# Patient Record
Sex: Female | Born: 1990 | Hispanic: No | Marital: Married | Smoking: Never smoker
Health system: Southern US, Community
[De-identification: ages and names within clinical notes are randomized; demographics above are authoritative.]

## PROBLEM LIST (undated history)

## (undated) ENCOUNTER — Inpatient Hospital Stay (HOSPITAL_COMMUNITY): Payer: Self-pay

## (undated) DIAGNOSIS — Z789 Other specified health status: Secondary | ICD-10-CM

## (undated) DIAGNOSIS — E119 Type 2 diabetes mellitus without complications: Secondary | ICD-10-CM

## (undated) HISTORY — DX: Type 2 diabetes mellitus without complications: E11.9

## (undated) HISTORY — PX: NO PAST SURGERIES: SHX2092

---

## 2016-04-06 ENCOUNTER — Emergency Department (HOSPITAL_BASED_OUTPATIENT_CLINIC_OR_DEPARTMENT_OTHER)
Admission: EM | Admit: 2016-04-06 | Discharge: 2016-04-06 | Disposition: A | Discharge status: Home / Self Care | Payer: Medicaid Other | Attending: Emergency Medicine | Admitting: Emergency Medicine

## 2016-04-06 ENCOUNTER — Encounter (HOSPITAL_BASED_OUTPATIENT_CLINIC_OR_DEPARTMENT_OTHER): Payer: Self-pay | Admitting: *Deleted

## 2016-04-06 ENCOUNTER — Emergency Department (HOSPITAL_BASED_OUTPATIENT_CLINIC_OR_DEPARTMENT_OTHER): Payer: Medicaid Other

## 2016-04-06 DIAGNOSIS — O26891 Other specified pregnancy related conditions, first trimester: Secondary | ICD-10-CM | POA: Diagnosis not present

## 2016-04-06 DIAGNOSIS — O219 Vomiting of pregnancy, unspecified: Secondary | ICD-10-CM | POA: Insufficient documentation

## 2016-04-06 DIAGNOSIS — Z79899 Other long term (current) drug therapy: Secondary | ICD-10-CM | POA: Diagnosis not present

## 2016-04-06 DIAGNOSIS — Z3A01 Less than 8 weeks gestation of pregnancy: Secondary | ICD-10-CM | POA: Insufficient documentation

## 2016-04-06 DIAGNOSIS — R51 Headache: Secondary | ICD-10-CM | POA: Diagnosis not present

## 2016-04-06 LAB — BASIC METABOLIC PANEL
ANION GAP: 6 (ref 5–15)
BUN: 7 mg/dL (ref 6–20)
CALCIUM: 9.3 mg/dL (ref 8.9–10.3)
CHLORIDE: 105 mmol/L (ref 101–111)
CO2: 24 mmol/L (ref 22–32)
Creatinine, Ser: 0.52 mg/dL (ref 0.44–1.00)
GFR calc non Af Amer: >60 mL/min (ref >60)
Glucose, Bld: 93 mg/dL (ref 65–99)
Potassium: 3.6 mmol/L (ref 3.5–5.1)
Sodium: 135 mmol/L (ref 135–145)

## 2016-04-06 LAB — URINALYSIS, ROUTINE W REFLEX MICROSCOPIC
Bilirubin Urine: NEGATIVE
GLUCOSE, UA: NEGATIVE mg/dL
HGB URINE DIPSTICK: NEGATIVE
Ketones, ur: NEGATIVE mg/dL
Nitrite: NEGATIVE
Protein, ur: NEGATIVE mg/dL
SPECIFIC GRAVITY, URINE: 1.02 (ref 1.005–1.030)
pH: 6 (ref 5.0–8.0)

## 2016-04-06 LAB — CBC WITH DIFFERENTIAL/PLATELET
BASOS PCT: 0 %
Basophils Absolute: 0 10*3/uL (ref 0.0–0.1)
Eosinophils Absolute: 0.1 10*3/uL (ref 0.0–0.7)
Eosinophils Relative: 1 %
HEMATOCRIT: 36.6 % (ref 36.0–46.0)
HEMOGLOBIN: 12.5 g/dL (ref 12.0–15.0)
LYMPHS ABS: 1.7 10*3/uL (ref 0.7–4.0)
Lymphocytes Relative: 17 %
MCH: 27.4 pg (ref 26.0–34.0)
MCHC: 34.2 g/dL (ref 30.0–36.0)
MCV: 80.1 fL (ref 78.0–100.0)
MONOS PCT: 8 %
Monocytes Absolute: 0.8 10*3/uL (ref 0.1–1.0)
NEUTROS ABS: 7.4 10*3/uL (ref 1.7–7.7)
NEUTROS PCT: 74 %
Platelets: 363 10*3/uL (ref 150–400)
RBC: 4.57 MIL/uL (ref 3.87–5.11)
RDW: 14.4 % (ref 11.5–15.5)
WBC: 9.9 10*3/uL (ref 4.0–10.5)

## 2016-04-06 LAB — URINE MICROSCOPIC-ADD ON

## 2016-04-06 LAB — PREGNANCY, URINE: Preg Test, Ur: POSITIVE — AB

## 2016-04-06 LAB — HCG, QUANTITATIVE, PREGNANCY: hCG, Beta Chain, Quant, S: 118630 m[IU]/mL — ABNORMAL HIGH (ref <5)

## 2016-04-06 MED ORDER — MECLIZINE HCL 12.5 MG PO TABS: 12.5000 mg | ORAL_TABLET | Freq: Three times a day (TID) | ORAL | 1 refills | Status: DC | PRN

## 2016-04-06 MED ORDER — METOCLOPRAMIDE HCL 5 MG/ML IJ SOLN
10.0000 mg | Freq: Once | INTRAMUSCULAR | Status: AC
  Administered 2016-04-06: 10 mg via INTRAVENOUS
  Filled 2016-04-06: qty 2

## 2016-04-06 MED ORDER — PRENATAL VITAMINS 28-0.8 MG PO TABS: 1.0000 | ORAL_TABLET | ORAL | 1 refills | Status: DC

## 2016-04-06 MED ORDER — DIPHENHYDRAMINE HCL 50 MG/ML IJ SOLN
25.0000 mg | Freq: Once | INTRAMUSCULAR | Status: AC
  Administered 2016-04-06: 25 mg via INTRAVENOUS
  Filled 2016-04-06: qty 1

## 2016-04-06 MED ORDER — SODIUM CHLORIDE 0.9 % IV BOLUS (SEPSIS)
1000.0000 mL | Freq: Once | INTRAVENOUS | Status: AC
  Administered 2016-04-06: 1000 mL via INTRAVENOUS

## 2016-04-06 NOTE — ED Triage Notes (Signed)
[redacted] weeks pregnant. C.o vomiting. Headache.

## 2016-04-06 NOTE — ED Provider Notes (Signed)
[facility]   CSN: [id number] Arrival date & time: 04/06/16  1604   By signing my name below, I, Valerie Clark, attest that this documentation has been prepared under the direction and in the presence of Everlene FarrierWilliam Dail Meece, PA-C. Electronically Signed: Teofilo PodMatthew P. Clark, ED Scribe. 04/06/2016. 4:44 PM.   History   Chief Complaint Chief Complaint  Patient presents with  . Emesis  . Pregnant    The history is provided by the patient. No language interpreter was used.   HPI Comments:  Valerie Clark is a [id number] [age] y.o. female who presents to the Emergency Department complaining of multiple episodes of emesis since yesterday. Pt states the she has not been able to eat any food without vomiting since yesterday morning. Pt complains of associated nausea for 2 weeks, headache, and intermittent dysuria. No dysuria recently. Pt notes a subjective fever yesterday, but this has resolved. Pt is currently [redacted] weeks pregnant, LNMP was 02/04/2016, and had a positive home pregnancy test. Pt notes a miscarriage ~6 months ago. Pt states that she has had no prenatal care. No alleviating factors noted. Pt denies any abdominal surgeries. Pt denies fever, chills, abdominal pain, vaginal discharge, vaginal bleeding, cough, chest pain, SOB.   History reviewed. No pertinent past medical history.  There are no active problems to display for this patient.   History reviewed. No pertinent surgical history.  OB History    Gravida Para Term Preterm AB Living   1             SAB TAB Ectopic Multiple Live Births                   Home Medications    Prior to Admission medications   Medication Sig Start Date End Date Taking? Authorizing Provider  meclizine (ANTIVERT) 12.5 MG tablet Take 1 tablet (12.5 mg total) by mouth 3 (three) times daily as needed for nausea. 04/06/16   Everlene FarrierWilliam Burma Ketcher, PA-C  Prenatal Vit-Fe Fumarate-FA (PRENATAL VITAMINS) 28-0.8 MG TABS Take 1 tablet by mouth  as directed. 04/06/16   Everlene FarrierWilliam Victorina Kable, PA-C    Family History No family history on file.  Social History Social History  Substance Use Topics  . Smoking status: Never Smoker  . Smokeless tobacco: Never Used  . Alcohol use No     Allergies   Review of patient's allergies indicates no known allergies.   Review of Systems Review of Systems  Constitutional: Negative for chills and fever.  HENT: Negative for congestion and sore throat.   Eyes: Negative for visual disturbance.  Respiratory: Negative for cough and shortness of breath.   Cardiovascular: Negative for chest pain.  Gastrointestinal: Positive for nausea and vomiting. Negative for abdominal pain, blood in stool and diarrhea.  Genitourinary: Positive for dysuria (resolved. ). Negative for difficulty urinating, hematuria, pelvic pain, vaginal bleeding, vaginal discharge and vaginal pain.  Musculoskeletal: Negative for back pain and neck pain.  Skin: Negative for rash.  Neurological: Positive for light-headedness (resolved ) and headaches. Negative for dizziness, syncope, weakness and numbness.  All other systems reviewed and are negative.    Physical Exam Updated Vital Signs BP 108/61 (BP Location: Left Arm)   Pulse 90   Temp 98 F (36.7 C) (Oral)   Resp 18   Ht 5\' 4"  (1.626 m)   Wt 60 kg   LMP 02/04/2016   SpO2 100%   BMI 22.71 kg/m   Physical Exam  Constitutional: She is oriented to  person, place, and time. She appears well-developed and well-nourished. No distress.  Nontoxic appearing.  HENT:  Head: Normocephalic and atraumatic.  Mouth/Throat: Oropharynx is clear and moist.  Eyes: Conjunctivae are normal. Pupils are equal, round, and reactive to light. Right eye exhibits no discharge. Left eye exhibits no discharge.  Neck: Neck supple.  Cardiovascular: Normal rate, regular rhythm, normal heart sounds and intact distal pulses.   Pulmonary/Chest: Effort normal and breath sounds normal. No respiratory  distress. She has no wheezes. She has no rales.  Abdominal: Soft. Bowel sounds are normal. She exhibits no distension. There is no tenderness. There is no guarding.  Abdomen is soft and nontender to palpation.  Musculoskeletal: She exhibits no edema.  Lymphadenopathy:    She has no cervical adenopathy.  Neurological: She is alert and oriented to person, place, and time. Coordination normal.  Skin: Skin is warm and dry. Capillary refill takes less than 2 seconds. No rash noted. She is not diaphoretic. No erythema. No pallor.  Psychiatric: She has a normal mood and affect. Her behavior is normal.  Nursing note and vitals reviewed.    ED Treatments / Results  DIAGNOSTIC STUDIES:  Oxygen Saturation is 100% on RA, normal by my interpretation.    COORDINATION OF CARE:  4:44 PM Discussed treatment plan with pt at bedside and pt agreed to plan.   Labs (all labs ordered are listed, but only abnormal results are displayed) Labs Reviewed  URINALYSIS, ROUTINE W REFLEX MICROSCOPIC (NOT AT [facility]) - Abnormal; Notable for the following:       Result Value   APPearance CLOUDY (*)    Leukocytes, UA TRACE (*)    All other components within normal limits  PREGNANCY, URINE - Abnormal; Notable for the following:    Preg Test, Ur POSITIVE (*)    All other components within normal limits  HCG, QUANTITATIVE, PREGNANCY - Abnormal; Notable for the following:    hCG, Beta Chain, Quant, S 118,630 (*)    All other components within normal limits  URINE MICROSCOPIC-ADD ON - Abnormal; Notable for the following:    Squamous Epithelial / LPF 6-30 (*)    Bacteria, UA FEW (*)    All other components within normal limits  URINE CULTURE  BASIC METABOLIC PANEL  CBC WITH DIFFERENTIAL/PLATELET    EKG  EKG Interpretation None       Radiology US Ob Comp Less 14 Wks  Result Date: 04/06/2016 CLINICAL DATA:  Severe nausea and vomiting for 2 weeks, quantitative HCG is 118,630. EXAM: OBSTETRIC <14 WK [place] AND  TRANSVAGINAL OB US TECHNIQUE: Both transabdominal and transvaginal ultrasound examinations were performed for complete evaluation of the gestation as well as the maternal uterus, adnexal regions, and pelvic cul-de-sac. Transvaginal technique was performed to assess early pregnancy. COMPARISON:  None. FINDINGS: Intrauterine gestational sac: Single intrauterine gestation. Yolk sac:  Visualized Embryo:  Visualized Cardiac Activity: Visualized Heart Rate: 175  bpm CRL:  14  mm   7 w   6 d                  [place] EDC: 11/17/2016 Subchorionic hemorrhage:  None visualized. Maternal uterus/adnexae: Uterus demonstrates no focal myometrial abnormalities. Right ovary within normal limits and measures 2.3 x 2.8 x 1.8 cm. The left ovary measures 2.4 by 2.7 x 3.8 cm. Probable corpus luteal cyst in the left ovary. No significant free fluid. IMPRESSION: Single intrauterine gestation with fetal cardiac activity of 175 beats per minute. Probable corpus luteal cyst in  the left ovary. Electronically Signed   By: Jasmine Pang M.D.   On: 04/06/2016 18:58   US Ob Transvaginal  Result Date: 04/06/2016 CLINICAL DATA:  Severe nausea and vomiting for 2 weeks, quantitative HCG is 118,630. EXAM: OBSTETRIC <14 WK [place] AND TRANSVAGINAL OB US TECHNIQUE: Both transabdominal and transvaginal ultrasound examinations were performed for complete evaluation of the gestation as well as the maternal uterus, adnexal regions, and pelvic cul-de-sac. Transvaginal technique was performed to assess early pregnancy. COMPARISON:  None. FINDINGS: Intrauterine gestational sac: Single intrauterine gestation. Yolk sac:  Visualized Embryo:  Visualized Cardiac Activity: Visualized Heart Rate: 175  bpm CRL:  14  mm   7 w   6 d                  [place] EDC: 11/17/2016 Subchorionic hemorrhage:  None visualized. Maternal uterus/adnexae: Uterus demonstrates no focal myometrial abnormalities. Right ovary within normal limits and measures 2.3 x 2.8 x 1.8 cm. The left ovary measures  2.4 by 2.7 x 3.8 cm. Probable corpus luteal cyst in the left ovary. No significant free fluid. IMPRESSION: Single intrauterine gestation with fetal cardiac activity of 175 beats per minute. Probable corpus luteal cyst in the left ovary. Electronically Signed   By: Jasmine Pang M.D.   On: 04/06/2016 18:58    Procedures Procedures (including critical care time)  Medications Ordered in ED Medications  sodium chloride 0.9 % bolus 1,000 mL (0 mLs Intravenous Stopped 04/06/16 1740)  metoCLOPramide (REGLAN) injection 10 mg (10 mg Intravenous Given 04/06/16 1702)  diphenhydrAMINE (BENADRYL) injection 25 mg (25 mg Intravenous Given 04/06/16 1700)     Initial Impression / Assessment and Plan / ED Course  I have reviewed the triage vital signs and the nursing notes.  Pertinent labs & imaging results that were available during my care of the patient were reviewed by me and considered in my medical decision making (see chart for details).  Clinical Course   Patient presented to the emergency department complaining of nausea and vomiting since yesterday. She reports she is newly pregnant and has had a positive home pregnancy test. She reports nausea in the mornings over the past 2 weeks. This worsened yesterday. She denies any abdominal pain, vaginal bleeding or vaginal discharge. No pelvic pain. On exam the patient is afebrile nontoxic appearing. Her abdomen is soft and nontender palpation.  CBC and BMP are within normal limits. Urinalysis is nitrite negative with trace leukocytes. Urine sent for culture. Quantitative hCG is 118,630. Ultrasound was obtained to confirm intrauterine pregnancy. Ultrasound reveals a single intrauterine gestation that is 7 weeks and 6 days. Patient received Reglan and a fluid bolus. Recheck she reports her nausea has resolved. She has tolerated graham crackers and ginger ale without nausea or vomiting. I discussed methods to help with nausea and vomiting and will place her on  meclizine for nausea. Patient is does not have her Medicaid insurance yet and would not be able to pay for Diclegis today. According to up-to-date, meclizine is the recommended treatment. I did discuss that there are always precautions and using any medication during pregnancy. She is aware of the risks. Will have her follow up with the [facility] outpatient clinic. I discussed return precautions. I advised the patient to follow-up with their primary care provider this week. I advised the patient to return to the emergency department with new or worsening symptoms or new concerns. The patient verbalized understanding and agreement with plan.      Final  Clinical Impressions(s) / ED Diagnoses   Final diagnoses:  Nausea and vomiting during pregnancy  Less than [redacted] weeks gestation of pregnancy    New Prescriptions New Prescriptions   MECLIZINE (ANTIVERT) 12.5 MG TABLET    Take 1 tablet (12.5 mg total) by mouth 3 (three) times daily as needed for nausea.   PRENATAL VIT-FE FUMARATE-FA (PRENATAL VITAMINS) 28-0.8 MG TABS    Take 1 tablet by mouth as directed.   I personally performed the services described in this documentation, which was scribed in my presence. The recorded information has been reviewed and is accurate.       Everlene FarrierWilliam Shaolin Armas, PA-C 04/06/16 1930    Doug SouSam Jacubowitz, MD 04/06/16 [phone]

## 2016-04-06 NOTE — ED Notes (Signed)
Patient denies pain and is resting comfortably.  

## 2016-04-08 LAB — URINE CULTURE: Culture: 80000 — AB

## 2016-04-09 ENCOUNTER — Telehealth (HOSPITAL_BASED_OUTPATIENT_CLINIC_OR_DEPARTMENT_OTHER): Payer: Self-pay

## 2016-04-09 NOTE — Telephone Encounter (Signed)
Post ED Visit - Positive Culture Follow-up: Unsuccessful Patient Follow-up  Culture assessed and recommendations reviewed by: []  Enzo BiNathan Batchelder, Pharm.D. []  Celedonio MiyamotoJeremy Frens, Pharm.D., BCPS []  Garvin FilaMike Maccia, Pharm.D. []  Georgina PillionElizabeth Martin, Pharm.D., BCPS []  CroydonMinh Pham, VermontPharm.D., BCPS, AAHIVP []  Estella HuskMichelle Turner, Pharm.D., BCPS, AAHIVP []  Tennis Mustassie Stewart, Pharm.D. []  Sherle Poeob Vincent, [address]Pharm[address] Casilda Carlsaylor Stone Pharm D Positive urine culture  []  Patient discharged without antimicrobial prescription and treatment is now indicated []  Organism is resistant to prescribed ED discharge antimicrobial []  Patient with positive blood cultures Symptom follow-up and to [facility]  Unable to contact patient after 3 attempts, letter will be sent to address on file  Jerry CarasCullom, Jany Buckwalter Burnett 04/09/2016, 11:40 AM

## 2016-04-09 NOTE — Telephone Encounter (Signed)
Pt returned call. Instructed to have Ob/Gyn repeat UA/UC. States no appt yet but will call Monday.

## 2016-05-05 ENCOUNTER — Ambulatory Visit (INDEPENDENT_AMBULATORY_CARE_PROVIDER_SITE_OTHER): Payer: Medicaid Other | Admitting: Certified Nurse Midwife

## 2016-05-05 ENCOUNTER — Encounter: Payer: Self-pay | Admitting: Certified Nurse Midwife

## 2016-05-05 ENCOUNTER — Other Ambulatory Visit (HOSPITAL_COMMUNITY)
Admission: RE | Admit: 2016-05-05 | Discharge: 2016-05-05 | Disposition: A | Discharge status: Home / Self Care | Payer: Medicaid Other | Source: Ambulatory Visit | Attending: Obstetrics | Admitting: Obstetrics

## 2016-05-05 VITALS — BP 102/68 | HR 94 | Wt 129.0 lb

## 2016-05-05 DIAGNOSIS — O219 Vomiting of pregnancy, unspecified: Secondary | ICD-10-CM

## 2016-05-05 DIAGNOSIS — Z01419 Encounter for gynecological examination (general) (routine) without abnormal findings: Secondary | ICD-10-CM | POA: Diagnosis present

## 2016-05-05 DIAGNOSIS — O364XX0 Maternal care for intrauterine death, not applicable or unspecified: Secondary | ICD-10-CM

## 2016-05-05 DIAGNOSIS — O09291 Supervision of pregnancy with other poor reproductive or obstetric history, first trimester: Secondary | ICD-10-CM

## 2016-05-05 DIAGNOSIS — O0990 Supervision of high risk pregnancy, unspecified, unspecified trimester: Secondary | ICD-10-CM | POA: Insufficient documentation

## 2016-05-05 DIAGNOSIS — Z3490 Encounter for supervision of normal pregnancy, unspecified, unspecified trimester: Secondary | ICD-10-CM

## 2016-05-05 LAB — POCT URINALYSIS DIPSTICK
BILIRUBIN UA: NEGATIVE
GLUCOSE UA: NEGATIVE
Ketones, UA: NEGATIVE
Leukocytes, UA: NEGATIVE
NITRITE UA: NEGATIVE
PH UA: 6
Protein, UA: NEGATIVE
SPEC GRAV UA: 1.02
UROBILINOGEN UA: NEGATIVE

## 2016-05-05 LAB — POCT RAPID STREP A (OFFICE): Rapid Strep A Screen: NEGATIVE

## 2016-05-05 MED ORDER — DOXYLAMINE-PYRIDOXINE 10-10 MG PO TBEC: DELAYED_RELEASE_TABLET | ORAL | 4 refills | Status: DC

## 2016-05-05 NOTE — Progress Notes (Signed)
Subjective:    Valerie Clark is being seen today for her first obstetrical visit.  This is a planned pregnancy. She is at [redacted]w[redacted]d gestation. Her obstetrical history is significant for IUFD 12/2014. Relationship with FOB: spouse, living together. Patient does intend to breast feed. Pregnancy history fully reviewed.  URI symptoms with sore throat and mild fever off and on for a few weeks, has tried OTC cough medicine without relief.  [facility] flu shot 6 [facility] ago [facility].  Had IUFD around 5 months gestation in   The information documented in the HPI was reviewed and verified.  Menstrual History: OB History    Gravida Para Term Preterm AB Living   2       1     SAB TAB Ectopic Multiple Live Births   1               Patient's last menstrual period was 02/13/2016.    History reviewed. No pertinent past medical history.  History reviewed. No pertinent surgical history.   (Not in a hospital admission) No Known Allergies  Social History  Substance Use Topics  . Smoking status: Never Smoker  . Smokeless tobacco: Never Used  . Alcohol use No    History reviewed. No pertinent family history.   Review of Systems Constitutional: negative for weight loss Gastrointestinal: negative for vomiting Genitourinary:negative for genital lesions and vaginal discharge and dysuria Musculoskeletal:negative for back pain Behavioral/Psych: negative for abusive relationship, depression, illegal drug usage and tobacco use    Objective:    BP 102/68   Pulse 94   Wt 129 lb (58.5 kg)   LMP 02/13/2016   BMI 22.14 kg/m  General Appearance:    Alert, cooperative, no distress, appears stated age  Head:    Normocephalic, without obvious abnormality, atraumatic  Eyes:    PERRL, conjunctiva/corneas clear, EOM's intact, fundi    benign, both eyes  Ears:    Normal TM's and external ear canals, both ears  Nose:   Nares normal, septum midline, mucosa normal, no drainage    or sinus tenderness  Throat:    Lips, mucosa, and tongue normal; teeth and gums normal  Neck:   Supple, symmetrical, trachea midline, no adenopathy;    thyroid:  no enlargement/tenderness/nodules; no carotid   bruit or JVD  Back:     Symmetric, no curvature, ROM normal, no CVA tenderness  Lungs:     Clear to auscultation bilaterally, respirations unlabored  Chest Wall:    No tenderness or deformity   Heart:    Regular rate and rhythm, S1 and S2 normal, no murmur, rub   or gallop  Breast Exam:    No tenderness, masses, or nipple abnormality  Abdomen:     Soft, non-tender, bowel sounds active all four quadrants,    no masses, no organomegaly  Genitalia:    Normal female without lesion, discharge or tenderness  Extremities:   Extremities normal, atraumatic, no cyanosis or edema  Pulses:   2+ and symmetric all extremities  Skin:   Skin color, texture, turgor normal, no rashes or lesions  Lymph nodes:   Cervical, supraclavicular, and axillary nodes normal  Neurologic:   CNII-XII intact, normal strength, sensation and reflexes    throughout      Cervix:  Long, thick, closed and posterior.  FHR: 148 by doppler.  FH: less than U.     Lab Review Urine pregnancy test Labs reviewed yes Radiologic studies reviewed yes Assessment:  Pregnancy at 4269w5d weeks   H/O of IUFD around 23 weeks  Plan:      Prenatal vitamins.  Counseling provided regarding continued use of seat belts, cessation of alcohol consumption, smoking or use of illicit drugs; infection precautions i.e., influenza/TDAP immunizations, toxoplasmosis,CMV, parvovirus, listeria and varicella; workplace safety, exercise during pregnancy; routine dental care, safe medications, sexual activity, hot tubs, saunas, pools, travel, caffeine use, fish and methlymercury, potential toxins, hair treatments, varicose veins Weight gain recommendations per IOM guidelines reviewed: underweight/BMI< 18.5--> gain 28 - 40 lbs; normal weight/BMI 18.5 - 24.9--> gain 25 - 35 lbs;  overweight/BMI 25 - 29.9--> gain 15 - 25 lbs; obese/BMI >30->gain  11 - 20 lbs Problem list reviewed and updated. FIRST/CF mutation testing/NIPT/QUAD SCREEN/fragile X/Ashkenazi Jewish population testing/Spinal muscular atrophy discussed: requested. Role of ultrasound in pregnancy discussed; fetal survey: requested. Amniocentesis discussed: not indicated. VBAC calculator score: VBAC consent form provided Meds ordered this encounter  Medications  . Doxylamine-Pyridoxine (DICLEGIS) 10-10 MG TBEC    Sig: Take 1 tablet with breakfast and lunch.  Take 2 tablets at bedtime.    Dispense:  100 tablet    Refill:  4   Orders Placed This Encounter  Procedures  . Culture, OB Urine  . Influenza a and b  . Result  . US MFM OB DETAIL +14 WK    Standing Status:   Future    Standing Expiration Date:   07/06/2017    Order Specific Question:   Reason for Exam (SYMPTOM  OR DIAGNOSIS REQUIRED)    Answer:   fetal anatomy scan    Order Specific Question:   Preferred imaging location?    Answer:   [facility]Ul[facility]ound  . Hemoglobinopathy evaluation  . Varicella zoster antibody, IgG  . VITAMIN D 25 Hydroxy (Vit-D Deficiency, Fractures)  . Cystic Fibrosis Mutation 97  . Obstetric Panel, Including HIV  . ToxASSURE Select 13 (MW), Urine  . MaterniT21 PLUS Core+SCA    Order Specific Question:   Is the patient insulin dependent?    Answer:   No    Order Specific Question:   Please enter gestational age. This should be expressed as weeks AND days, i.e. 16w 6d. Enter weeks here. Enter days in next question.    Answer:   8711    Order Specific Question:   Please enter gestational age. This should be expressed as weeks AND days, i.e. 16w 6d. Enter days here. Enter weeks in previous question.    Answer:   5    Order Specific Question:   How was gestational age calculated?    Answer:   LMP    Order Specific Question:   Please give the date of LMP OR Ultrasound OR Estimated date of delivery.    Answer:   11/19/2016     Order Specific Question:   Number of Fetuses (Type of Pregnancy):    Answer:   1    Order Specific Question:   Indications for performing the test? (please choose all that apply):    Answer:   Routine screening    Order Specific Question:   Other Indications? (Y=Yes, N=No)    Answer:   N    Order Specific Question:   If this is a repeat specimen, please indicate the reason:    Answer:   Not indicated    Order Specific Question:   Please specify the patient's race: (C=White/Caucasion, B=Black, I=Native American, A=Asian, H=Hispanic, O=Other, U=Unknown)    Answer:   [place]  Order Specific Question:   Donor Egg - indicate if the egg was obtained from in vitro fertilization.    Answer:   N    Order Specific Question:   Age of Egg Donor.    Answer:   [age]    Order Specific Question:   Prior Down Syndrome/ONTD screening during current pregnancy.    Answer:   N    Order Specific Question:   Prior First Trimester Testing    Answer:   N    Order Specific Question:   Prior Second Trimester Testing    Answer:   N    Order Specific Question:   Family History of Neural Tube Defects    Answer:   N    Order Specific Question:   Prior Pregnancy with Down Syndrome    Answer:   N    Order Specific Question:   Please give the patient's weight (in pounds)    Answer:   129  . Please note:  . POCT urinalysis dipstick  . POCT rapid strep A    Follow up in 4 weeks. 50% of 30 min visit spent on counseling and coordination of care.

## 2016-05-06 LAB — INFLUENZA A AND B
INFLUENZA A AG, EIA: NEGATIVE
INFLUENZA B AG, EIA: NEGATIVE

## 2016-05-06 LAB — PLEASE NOTE:

## 2016-05-08 DIAGNOSIS — Z8759 Personal history of other complications of pregnancy, childbirth and the puerperium: Secondary | ICD-10-CM | POA: Insufficient documentation

## 2016-05-08 LAB — URINE CULTURE, OB REFLEX

## 2016-05-08 LAB — CULTURE, OB URINE

## 2016-05-09 LAB — CYTOLOGY - PAP: Diagnosis: NEGATIVE

## 2016-05-10 LAB — NUSWAB VG+, CANDIDA 6SP
CANDIDA ALBICANS, NAA: NEGATIVE
CANDIDA TROPICALIS, NAA: NEGATIVE
Candida glabrata, NAA: NEGATIVE
Candida krusei, NAA: NEGATIVE
Candida lusitaniae, NAA: NEGATIVE
Candida parapsilosis, NAA: NEGATIVE
Chlamydia trachomatis, NAA: NEGATIVE
Neisseria gonorrhoeae, NAA: NEGATIVE
TRICH VAG BY NAA: NEGATIVE

## 2016-05-11 LAB — TOXASSURE SELECT 13 (MW), URINE

## 2016-05-12 ENCOUNTER — Other Ambulatory Visit: Payer: Self-pay | Admitting: Certified Nurse Midwife

## 2016-05-12 DIAGNOSIS — R7989 Other specified abnormal findings of blood chemistry: Secondary | ICD-10-CM | POA: Insufficient documentation

## 2016-05-12 LAB — OBSTETRIC PANEL, INCLUDING HIV
Antibody Screen: NEGATIVE
BASOS ABS: 0 10*3/uL (ref 0.0–0.2)
Basos: 0 %
EOS (ABSOLUTE): 0.1 10*3/uL (ref 0.0–0.4)
Eos: 1 %
HIV SCREEN 4TH GENERATION: NONREACTIVE
Hematocrit: 38.8 % (ref 34.0–46.6)
Hemoglobin: 12.9 g/dL (ref 11.1–15.9)
Hepatitis B Surface Ag: NEGATIVE
Immature Grans (Abs): 0 10*3/uL (ref 0.0–0.1)
Immature Granulocytes: 0 %
LYMPHS ABS: 1.8 10*3/uL (ref 0.7–3.1)
Lymphs: 19 %
MCH: 27.7 pg (ref 26.6–33.0)
MCHC: 33.2 g/dL (ref 31.5–35.7)
MCV: 83 fL (ref 79–97)
MONOS ABS: 0.6 10*3/uL (ref 0.1–0.9)
Monocytes: 6 %
NEUTROS ABS: 7.1 10*3/uL — AB (ref 1.4–7.0)
Neutrophils: 74 %
PLATELETS: 431 10*3/uL — AB (ref 150–379)
RBC: 4.65 x10E6/uL (ref 3.77–5.28)
RDW: 15.8 % — AB (ref 12.3–15.4)
RPR: NONREACTIVE
Rh Factor: POSITIVE
Rubella Antibodies, IGG: 23.3 index (ref >0.99)
WBC: 9.6 10*3/uL (ref 3.4–10.8)

## 2016-05-12 LAB — HEMOGLOBINOPATHY EVALUATION
HEMOGLOBIN A2 QUANTITATION: 2.6 % (ref 0.7–3.1)
HGB C: 0 %
HGB S: 0 %
Hemoglobin F Quantitation: 0 % (ref 0.0–2.0)
Hgb A: 97.4 % (ref 94.0–98.0)

## 2016-05-12 LAB — VITAMIN D 25 HYDROXY (VIT D DEFICIENCY, FRACTURES): VIT D 25 HYDROXY: 17.8 ng/mL — AB (ref 30.0–100.0)

## 2016-05-12 LAB — CYSTIC FIBROSIS MUTATION 97: Interpretation: NOT DETECTED

## 2016-05-12 LAB — VARICELLA ZOSTER ANTIBODY, IGG: VARICELLA: 535 {index} (ref >165)

## 2016-05-12 MED ORDER — VITAMIN D (ERGOCALCIFEROL) 1.25 MG (50000 UNIT) PO CAPS: 50000.0000 [IU] | ORAL_CAPSULE | ORAL | 2 refills | Status: DC

## 2016-05-14 LAB — MATERNIT21 PLUS CORE+SCA
CHROMOSOME 18: NEGATIVE
Chromosome 13: NEGATIVE
Chromosome 21: NEGATIVE
Y Chromosome: NOT DETECTED

## 2016-05-18 ENCOUNTER — Other Ambulatory Visit: Payer: Self-pay | Admitting: Certified Nurse Midwife

## 2016-06-02 ENCOUNTER — Ambulatory Visit (INDEPENDENT_AMBULATORY_CARE_PROVIDER_SITE_OTHER): Payer: Medicaid Other | Admitting: Certified Nurse Midwife

## 2016-06-02 ENCOUNTER — Other Ambulatory Visit: Payer: Self-pay | Admitting: Certified Nurse Midwife

## 2016-06-02 VITALS — BP 107/69 | HR 102 | Wt 129.0 lb

## 2016-06-02 DIAGNOSIS — O364XX0 Maternal care for intrauterine death, not applicable or unspecified: Secondary | ICD-10-CM

## 2016-06-02 DIAGNOSIS — O0992 Supervision of high risk pregnancy, unspecified, second trimester: Secondary | ICD-10-CM

## 2016-06-02 DIAGNOSIS — R7989 Other specified abnormal findings of blood chemistry: Secondary | ICD-10-CM

## 2016-06-02 DIAGNOSIS — O0990 Supervision of high risk pregnancy, unspecified, unspecified trimester: Secondary | ICD-10-CM

## 2016-06-02 MED ORDER — OB COMPLETE PETITE 35-5-1-200 MG PO CAPS: 1.0000 | ORAL_CAPSULE | Freq: Every day | ORAL | 12 refills | Status: DC

## 2016-06-02 MED ORDER — ONDANSETRON HCL 8 MG PO TABS: 8.0000 mg | ORAL_TABLET | Freq: Three times a day (TID) | ORAL | 2 refills | Status: DC | PRN

## 2016-06-02 NOTE — Progress Notes (Signed)
  Subjective:    Valerie Clark is a 25 y.o. female being seen today for her obstetrical visit. She is at 5148w5d gestation. Patient reports: nausea, no bleeding, no contractions, no cramping, no leaking and vomiting.  Problem List Items Addressed This Visit      Other   Supervision of high risk pregnancy, antepartum   IUFD at 20 weeks or more of gestation   Relevant Medications   ondansetron (ZOFRAN) 8 MG tablet   Other Relevant Orders   AMB referral to maternal fetal medicine   US MFM OB DETAIL +14 WK   Low serum vitamin D   Relevant Medications   ondansetron (ZOFRAN) 8 MG tablet   Other Relevant Orders   AMB referral to maternal fetal medicine   US MFM OB DETAIL +14 WK    Other Visit Diagnoses    Supervision of high risk pregnancy in second trimester    -  Primary   Relevant Medications   ondansetron (ZOFRAN) 8 MG tablet   Prenat-FeCbn-FeAspGl-FA-Omega (OB COMPLETE PETITE) 35-5-1-200 MG CAPS   Other Relevant Orders   AMB referral to maternal fetal medicine   US MFM OB DETAIL +14 WK     Patient Active Problem List   Diagnosis Date Noted  . Low serum vitamin D 05/12/2016  . IUFD at 20 weeks or more of gestation 05/08/2016  . Supervision of high risk pregnancy, antepartum 05/05/2016    Objective:     BP 107/69   Pulse (!) 102   Wt 129 lb (58.5 kg)   LMP 02/13/2016   BMI 22.14 kg/m  Uterine Size: Below umbilicus   FHR: 154 by doppler  Assessment:    Pregnancy @ 4448w5d  weeks N&V in early pregnancy    Hx of IUFD  Plan:   Attempted early 2 hour OGTT, patient unable to keep down glucose drink: will repeat attempt another day.    Problem list reviewed and updated. Labs reviewed.  Follow up in 4 weeks. FIRST/CF mutation testing/NIPT/QUAD SCREEN/fragile X/Ashkenazi Jewish population testing/Spinal muscular atrophy discussed: ordered. Role of ultrasound in pregnancy discussed; fetal survey: ordered. Amniocentesis discussed: not indicated. 50% of 20 minute visit  spent on counseling and coordination of care.

## 2016-06-02 NOTE — Progress Notes (Signed)
Pt states some lower pelvic pain/pressure.

## 2016-06-05 NOTE — L&D Delivery Note (Signed)
Delivery Note At 6:58 PM a viable female was delivered via Vaginal, Spontaneous Delivery (Presentation:vertex ; LOA ).  APGAR: 9, 9; weight  .   Placenta status: spont,shultz .  Cord:  with the following complications: none .  Cord pH: n/a  Anesthesia:  1% lidocaine Episiotomy: None Lacerations: 2nd degree Suture Repair: 3.0 vicryl Est. Blood Loss (mL): 100  Mom to postpartum.  Baby to Couplet care / Skin to Skin.  Valerie Clark 11/12/2016, 7:25 PM

## 2016-06-08 LAB — COAG STUDIES INTERP REPORT

## 2016-06-08 LAB — ANTIPHOSPHOLIPID SYNDROME EVAL, BLD
APTT PPP: 28.7 s (ref 22.9–30.2)
Anticardiolipin IgM: <9 MPL U/mL (ref 0–12)
Beta-2 Glyco 1 IgM: <9 GPI IgM units (ref 0–32)
Beta-2 Glyco I IgG: <9 GPI IgG units (ref 0–20)
DRVVT: 33.2 s (ref 0.0–47.0)
HEXAGONAL PHASE PHOSPHOLIPID: 0 s (ref 0–11)
INR: 1 (ref 0.9–1.1)
PT: 10.4 s (ref 9.6–11.5)
THROMBIN TIME: 17.8 s (ref 0.0–23.0)

## 2016-06-08 LAB — HEMOGLOBIN A1C
Est. average glucose Bld gHb Est-mCnc: 94 mg/dL
Hgb A1c MFr Bld: 4.9 % (ref 4.8–5.6)

## 2016-06-12 ENCOUNTER — Encounter (HOSPITAL_COMMUNITY): Payer: Self-pay | Admitting: Certified Nurse Midwife

## 2016-06-22 ENCOUNTER — Encounter (HOSPITAL_COMMUNITY): Payer: Self-pay

## 2016-06-22 ENCOUNTER — Ambulatory Visit (HOSPITAL_COMMUNITY)
Admission: RE | Admit: 2016-06-22 | Discharge: 2016-06-22 | Disposition: A | Discharge status: Home / Self Care | Payer: Medicaid Other | Source: Ambulatory Visit | Attending: Certified Nurse Midwife | Admitting: Certified Nurse Midwife

## 2016-06-22 ENCOUNTER — Other Ambulatory Visit (HOSPITAL_COMMUNITY): Payer: Self-pay | Admitting: *Deleted

## 2016-06-22 ENCOUNTER — Other Ambulatory Visit: Payer: Self-pay | Admitting: Certified Nurse Midwife

## 2016-06-22 DIAGNOSIS — Z3689 Encounter for other specified antenatal screening: Secondary | ICD-10-CM

## 2016-06-22 DIAGNOSIS — Z3A18 18 weeks gestation of pregnancy: Secondary | ICD-10-CM | POA: Insufficient documentation

## 2016-06-22 DIAGNOSIS — O364XX0 Maternal care for intrauterine death, not applicable or unspecified: Secondary | ICD-10-CM

## 2016-06-22 DIAGNOSIS — O0992 Supervision of high risk pregnancy, unspecified, second trimester: Secondary | ICD-10-CM

## 2016-06-22 DIAGNOSIS — O0990 Supervision of high risk pregnancy, unspecified, unspecified trimester: Secondary | ICD-10-CM

## 2016-06-22 DIAGNOSIS — Z363 Encounter for antenatal screening for malformations: Secondary | ICD-10-CM | POA: Diagnosis not present

## 2016-06-22 DIAGNOSIS — O350XX0 Maternal care for (suspected) central nervous system malformation in fetus, not applicable or unspecified: Secondary | ICD-10-CM

## 2016-06-22 DIAGNOSIS — O09292 Supervision of pregnancy with other poor reproductive or obstetric history, second trimester: Secondary | ICD-10-CM | POA: Insufficient documentation

## 2016-06-22 DIAGNOSIS — R7989 Other specified abnormal findings of blood chemistry: Secondary | ICD-10-CM

## 2016-06-22 DIAGNOSIS — O3503X0 Maternal care for (suspected) central nervous system malformation or damage in fetus, choroid plexus cysts, not applicable or unspecified: Secondary | ICD-10-CM

## 2016-06-22 HISTORY — DX: Other specified health status: Z78.9

## 2016-06-22 NOTE — Addendum Note (Signed)
Encounter addended by: Alvaro Aungst Glidwell Sheily Lineman, MD on: 06/22/2016  3:06 PM<BR>    Actions taken: Charge Capture section accepted

## 2016-06-22 NOTE — Progress Notes (Signed)
The patient is a 26 year old [place] female 23P0100 with an EDC of 11/19/2016 giving her an EGA of 18+4 weeks. She was referred due to a history of IUFD in her first pregnancy in 2016. This pregnancy was delivered in [place]. She and her family have very little information concerning the actual delivery and evaluation. The patient stated that she felt no movement and went into her doctor's office and US showed an IUFD. She was told that the baby looked like it had been dead "about a week". She delivered the baby vaginally and said there were no grossly visible anomalies. Her description of the baby's condition at birth is consistent with postmortem changes. No autopsy was done. She does not think any workup was done either. She did not have any other labs drawn postpartum. She does not know the weight of the baby at birth or the exact gestational age. For this pregnancy she has already undergone Materniti21 PLUS in early December which came back negative for Trisomies 13,18 and 21 as well as sex chromosome aneuploidies. There is a repeat draw of this test on 06/02/2016 due to the fetal fraction being 4%. This is an acceptable fetal fraction and is adequate to provide an informative result.She also has had a full workup for antiphospholipid antibodies, which was negative. There is a normal HgbA1c as well, normal cystic fibrosis screening and normal hemoglobinopathy screening. Her only significant lab finding is that she is rubella nonimmune. Her history is completely unremarkable for acute or chronic disease. There is no family history suggestive of fetal structural anomalies or hereditary thrombophilia.  On US today, the anatomy is well visualized and is normal except for the presence of a unilateral right-sided choroid plexus cyst. Biometry confirms the EDC. Placentation is normal, as is fluid, movement and cardiac activity. Please see US report for the details of the scan  I had a detailed discussion with the  patient and her partner. I feel that you have performed all necessary laboratory evaluations to rule out a recurrent cause of fetal loss. It could be argued that an evaluation for genetic thrombophilia could be performed, but given the negative family history, the "pick-up rate" would be very low. I reassured her that the choroid plexus cyst was an innocuous finding in the face of a normal Materniti21 test, and that it would most likely be gone or resolving on her next Koreas. Although I cannot be 100% certain given the lack of data about the previous child and delivery, it is very unlikely that there will be a recurrent loss.  I have asked her to return in 4 weeks for repeat Koreas. I anticipate a repeat scan in the third trimester to check growth. I would recommend starting some form of antepartum testing at 30 weeks and continue it through delivery.  Thank you for the opportunity to be a part of the care of Valerie Clark. Please contact our office if we can be of further assistance.   I spent approximately 15 minutes with this patient with over 50% of time spent in face-to-face counseling.

## 2016-06-29 LAB — MATERNIT21 PLUS CORE+SCA

## 2016-06-30 ENCOUNTER — Ambulatory Visit (INDEPENDENT_AMBULATORY_CARE_PROVIDER_SITE_OTHER): Payer: Medicaid Other | Admitting: Certified Nurse Midwife

## 2016-06-30 ENCOUNTER — Other Ambulatory Visit: Payer: Medicaid Other

## 2016-06-30 VITALS — BP 106/72 | HR 83 | Temp 97.2°F | Wt 130.4 lb

## 2016-06-30 DIAGNOSIS — O09292 Supervision of pregnancy with other poor reproductive or obstetric history, second trimester: Secondary | ICD-10-CM

## 2016-06-30 DIAGNOSIS — O0990 Supervision of high risk pregnancy, unspecified, unspecified trimester: Secondary | ICD-10-CM

## 2016-06-30 DIAGNOSIS — O364XX0 Maternal care for intrauterine death, not applicable or unspecified: Secondary | ICD-10-CM

## 2016-06-30 DIAGNOSIS — R7989 Other specified abnormal findings of blood chemistry: Secondary | ICD-10-CM

## 2016-06-30 NOTE — Progress Notes (Signed)
Patient c/o constipation for the past couple days.

## 2016-06-30 NOTE — Patient Instructions (Signed)
AREA PEDIATRIC/FAMILY PRACTICE PHYSICIANS  [facility] [address], [address] [place], [place]  [address] Phone - [phone]   Fax - [phone]  [facility] [address] [place], [place] [address] Phone - [phone]   Fax - [phone]  [facility] [address] [place], [place]  [address] Phone - [phone]   Fax - [phone]  [facility] [address] [place], [place]  [address] Phone - [phone]   Fax - [phone]  [facility] [address] [place], [place]  [address] Phone - [phone]   Fax - [phone]  [facility] [address] [place], [place]  [address] Phone - [phone]   Fax - [phone]  [facility] [address] [place], [place]  [address] Phone - [phone]   Fax - [phone]  [facility] [address], [address] [place], [place]  [address] Phone - [phone]   Fax - [phone]  [facility] [address] [place], [place]  [address] Phone - [phone]   Fax - [phone] [facility] [address] [place], [place]  [address] Phone - [phone]   Fax - [phone]  [facility] [address] [place], [place]  [address] Phone - [phone]   Fax - [phone]  [facility] [address] [place], [place]  [address] Phone - [phone]   Fax - [phone]  [facility] [address], [address] [place], [place]  [address] Phone - [phone]   Fax - [phone]  [facility] [address] [place], [place]  [address] Phone - [phone]  [facility] [address] [place], [place]  [address] Phone - [phone]   Fax - [phone]  [facility] [address] [place], [place]  [address] Phone - [phone]   Fax - [phone]  [facility] [address] [place], [place]  [address] Phone - [phone]   Fax - [phone]   [facility] [address] [place], [place]  [address] Phone - [phone]   Fax - [phone]   [facility] [address] [place], [place]  [address] Phone - [phone]   Fax - [phone]  [facility] [address] [place], [place]  [address] Phone - [phone]   Fax - [phone]  [facility] [address] [place], [place]  [address] Phone - [phone]   Fax - [phone]  [facility] [address] , [place]  [address] Phone - [phone]   Fax - [phone]  [facility] [address] , [place]  [address] Phone - [phone]   Fax - [phone] [facility] [address] [place], [place]  [address] Phone - [phone]   Fax - [phone]  [facility] [address] [place], [place]  [address] Phone - [phone]   Fax - [phone]  [facility] [address] [place],   [address] Phone - [phone]   Fax - [phone]   

## 2016-07-01 LAB — CBC
HEMOGLOBIN: 12.2 g/dL (ref 11.1–15.9)
Hematocrit: 35.7 % (ref 34.0–46.6)
MCH: 28.4 pg (ref 26.6–33.0)
MCHC: 34.2 g/dL (ref 31.5–35.7)
MCV: 83 fL (ref 79–97)
Platelets: 315 10*3/uL (ref 150–379)
RBC: 4.3 x10E6/uL (ref 3.77–5.28)
RDW: 15.4 % (ref 12.3–15.4)
WBC: 7.9 10*3/uL (ref 3.4–10.8)

## 2016-07-01 LAB — GLUCOSE TOLERANCE, 2 HOURS W/ 1HR
GLUCOSE, 2 HOUR: 80 mg/dL (ref 65–152)
GLUCOSE, FASTING: 72 mg/dL (ref 65–91)
Glucose, 1 hour: 93 mg/dL (ref 65–179)

## 2016-07-01 LAB — HIV ANTIBODY (ROUTINE TESTING W REFLEX): HIV Screen 4th Generation wRfx: NONREACTIVE

## 2016-07-01 LAB — RPR: RPR: NONREACTIVE

## 2016-07-03 ENCOUNTER — Ambulatory Visit (HOSPITAL_COMMUNITY): Payer: Medicaid Other

## 2016-07-05 NOTE — Progress Notes (Signed)
   PRENATAL VISIT NOTE  Subjective:  Valerie Clark is a 26 y.o. G2P0010 at 8029w3d being seen today for ongoing prenatal care.  She is currently monitored for the following issues for this high-risk pregnancy and has Supervision of high risk pregnancy, antepartum; IUFD at 20 weeks or more of gestation; and Low serum vitamin D on her problem list.  Patient reports no bleeding, no contractions, no cramping, no leaking and constipation.  Contractions: Not present. Vag. Bleeding: None.  Movement: Present. Denies leaking of fluid.   The following portions of the patient's history were reviewed and updated as appropriate: allergies, current medications, past family history, past medical history, past social history, past surgical history and problem list. Problem list updated.  Objective:   Vitals:   06/30/16 0901  BP: 106/72  Pulse: 83  Temp: 97.2 F (36.2 C)  Weight: 130 lb 6.4 oz (59.1 kg)    Fetal Status: Fetal Heart Rate (bpm): 140 Fundal Height: 20 cm Movement: Present     General:  Alert, oriented and cooperative. Patient is in no acute distress.  Skin: Skin is warm and dry. No rash noted.   Cardiovascular: Normal heart rate noted  Respiratory: Normal respiratory effort, no problems with respiration noted  Abdomen: Soft, gravid, appropriate for gestational age. Pain/Pressure: Present     Pelvic:  Cervical exam deferred        Extremities: Normal range of motion.  Edema: None  Mental Status: Normal mood and affect. Normal behavior. Normal judgment and thought content.   Assessment and Plan:  Pregnancy: G2P0010 at 6929w3d  1. Low serum vitamin D     Taking vitamin D weekly  2. IUFD at 20 weeks or more of gestation      Supervision of high risk pregnancy, will start weekly BPPs @28  weeks.   3. Supervision of high risk pregnancy, antepartum     Early 2 hour OGTT ordered.  - CBC - HIV antibody - RPR - Glucose Tolerance, 2 Hours w/1 Hour  Preterm labor symptoms and general  obstetric precautions including but not limited to vaginal bleeding, contractions, leaking of fluid and fetal movement were reviewed in detail with the patient. Please refer to After Visit Summary for other counseling recommendations.  Return in about 4 weeks (around 07/28/2016).   Roe Coombsachelle A Jermarion Poffenberger, CNM

## 2016-07-21 ENCOUNTER — Ambulatory Visit (HOSPITAL_COMMUNITY)
Admission: RE | Admit: 2016-07-21 | Discharge: 2016-07-21 | Disposition: A | Discharge status: Home / Self Care | Payer: Medicaid Other | Source: Ambulatory Visit | Attending: Certified Nurse Midwife | Admitting: Certified Nurse Midwife

## 2016-07-21 ENCOUNTER — Other Ambulatory Visit (HOSPITAL_COMMUNITY): Payer: Self-pay | Admitting: Obstetrics and Gynecology

## 2016-07-21 ENCOUNTER — Encounter (HOSPITAL_COMMUNITY): Payer: Self-pay

## 2016-07-21 DIAGNOSIS — Z3A22 22 weeks gestation of pregnancy: Secondary | ICD-10-CM

## 2016-07-21 DIAGNOSIS — Z362 Encounter for other antenatal screening follow-up: Secondary | ICD-10-CM | POA: Diagnosis not present

## 2016-07-21 DIAGNOSIS — O0990 Supervision of high risk pregnancy, unspecified, unspecified trimester: Secondary | ICD-10-CM

## 2016-07-21 DIAGNOSIS — O09292 Supervision of pregnancy with other poor reproductive or obstetric history, second trimester: Secondary | ICD-10-CM

## 2016-07-21 DIAGNOSIS — O09299 Supervision of pregnancy with other poor reproductive or obstetric history, unspecified trimester: Secondary | ICD-10-CM | POA: Diagnosis not present

## 2016-07-24 ENCOUNTER — Ambulatory Visit (INDEPENDENT_AMBULATORY_CARE_PROVIDER_SITE_OTHER): Payer: Medicaid Other | Admitting: Certified Nurse Midwife

## 2016-07-24 VITALS — BP 131/82 | HR 106 | Wt 133.8 lb

## 2016-07-24 DIAGNOSIS — R7989 Other specified abnormal findings of blood chemistry: Secondary | ICD-10-CM

## 2016-07-24 DIAGNOSIS — O0992 Supervision of high risk pregnancy, unspecified, second trimester: Secondary | ICD-10-CM

## 2016-07-24 DIAGNOSIS — O0990 Supervision of high risk pregnancy, unspecified, unspecified trimester: Secondary | ICD-10-CM

## 2016-07-24 DIAGNOSIS — O364XX0 Maternal care for intrauterine death, not applicable or unspecified: Secondary | ICD-10-CM

## 2016-07-24 NOTE — Progress Notes (Signed)
   PRENATAL VISIT NOTE  Subjective:  Valerie Clark is a 26 y.o. G2P0010 at 5133w1d being seen today for ongoing prenatal care.  She is currently monitored for the following issues for this high-risk pregnancy and has Supervision of high risk pregnancy, antepartum; IUFD at 20 weeks or more of gestation; and Low serum vitamin D on her problem list.  Patient reports no complaints.  Contractions: Not present. Vag. Bleeding: None.  Movement: Present. Denies leaking of fluid.   The following portions of the patient's history were reviewed and updated as appropriate: allergies, current medications, past family history, past medical history, past social history, past surgical history and problem list. Problem list updated.  Objective:   Vitals:   07/24/16 1600  BP: 131/82  Pulse: (!) 106  Weight: 133 lb 12.8 oz (60.7 kg)    Fetal Status: Fetal Heart Rate (bpm): 155 Fundal Height: 23 cm Movement: Present     General:  Alert, oriented and cooperative. Patient is in no acute distress.  Skin: Skin is warm and dry. No rash noted.   Cardiovascular: Normal heart rate noted  Respiratory: Normal respiratory effort, no problems with respiration noted  Abdomen: Soft, gravid, appropriate for gestational age. Pain/Pressure: Absent     Pelvic:  Cervical exam deferred        Extremities: Normal range of motion.  Edema: None  Mental Status: Normal mood and affect. Normal behavior. Normal judgment and thought content.   Assessment and Plan:  Pregnancy: G2P0010 at 2333w1d  1. Supervision of high risk pregnancy, antepartum     Doing well, no complaints  2. Low serum vitamin D     Taking weekly vitamin D  3. IUFD at 20 weeks or more of gestation     Will start weekly BPP @28  weeks.  Preterm labor symptoms and general obstetric precautions including but not limited to vaginal bleeding, contractions, leaking of fluid and fetal movement were reviewed in detail with the patient. Please refer to After Visit  Summary for other counseling recommendations.  Return in about 4 weeks (around 08/21/2016) for 2 hr OGTT, HOB d/t previous IUFD.   Roe Coombsachelle A Hayleen Clinkscales, CNM

## 2016-07-24 NOTE — Progress Notes (Signed)
Patient has no concerns today. 

## 2016-07-25 ENCOUNTER — Other Ambulatory Visit (HOSPITAL_COMMUNITY): Payer: Self-pay | Admitting: *Deleted

## 2016-07-25 DIAGNOSIS — O09299 Supervision of pregnancy with other poor reproductive or obstetric history, unspecified trimester: Secondary | ICD-10-CM

## 2016-08-21 ENCOUNTER — Other Ambulatory Visit: Payer: Medicaid Other

## 2016-08-21 ENCOUNTER — Ambulatory Visit (INDEPENDENT_AMBULATORY_CARE_PROVIDER_SITE_OTHER): Payer: Medicaid Other | Admitting: Obstetrics and Gynecology

## 2016-08-21 VITALS — BP 112/76 | HR 90 | Wt 140.9 lb

## 2016-08-21 DIAGNOSIS — Z23 Encounter for immunization: Secondary | ICD-10-CM | POA: Diagnosis not present

## 2016-08-21 DIAGNOSIS — O364XX0 Maternal care for intrauterine death, not applicable or unspecified: Secondary | ICD-10-CM

## 2016-08-21 DIAGNOSIS — O09292 Supervision of pregnancy with other poor reproductive or obstetric history, second trimester: Secondary | ICD-10-CM

## 2016-08-21 DIAGNOSIS — O0990 Supervision of high risk pregnancy, unspecified, unspecified trimester: Secondary | ICD-10-CM

## 2016-08-21 MED ORDER — TETANUS-DIPHTH-ACELL PERTUSSIS 5-2.5-18.5 LF-MCG/0.5 IM SUSP
0.5000 mL | Freq: Once | INTRAMUSCULAR | Status: AC
  Administered 2016-08-21: 0.5 mL via INTRAMUSCULAR

## 2016-08-21 NOTE — Addendum Note (Signed)
Addended by: Catalina AntiguaONSTANT, Jaritza Duignan on: 08/21/2016 08:57 AM   Modules accepted: Orders

## 2016-08-21 NOTE — Progress Notes (Signed)
   PRENATAL VISIT NOTE  Subjective:  Valerie Clark is a 26 y.o. G2P0010 at 3071w1d being seen today for ongoing prenatal care.  She is currently monitored for the following issues for this high-risk pregnancy and has Supervision of high risk pregnancy, antepartum; IUFD at 20 weeks or more of gestation; and Low serum vitamin D on her problem list.  Patient reports no complaints.  Contractions: Not present. Vag. Bleeding: None.  Movement: Present. Denies leaking of fluid.   The following portions of the patient's history were reviewed and updated as appropriate: allergies, current medications, past family history, past medical history, past social history, past surgical history and problem list. Problem list updated.  Objective:   Vitals:   08/21/16 0812  BP: 112/76  Pulse: 90  Weight: 140 lb 14.4 oz (63.9 kg)    Fetal Status: Fetal Heart Rate (bpm): 150 Fundal Height: 28 cm Movement: Present     General:  Alert, oriented and cooperative. Patient is in no acute distress.  Skin: Skin is warm and dry. No rash noted.   Cardiovascular: Normal heart rate noted  Respiratory: Normal respiratory effort, no problems with respiration noted  Abdomen: Soft, gravid, appropriate for gestational age. Pain/Pressure: Absent     Pelvic:  Cervical exam deferred        Extremities: Normal range of motion.  Edema: None  Mental Status: Normal mood and affect. Normal behavior. Normal judgment and thought content.   Assessment and Plan:  Pregnancy: G2P0010 at 4771w1d  1. Supervision of high risk pregnancy, antepartum Patient is doing well without complaints Third trimester labs with glucola today Patient desires Tdap - Glucose Tolerance, 2 Hours w/1 Hour - CBC - HIV antibody (with reflex) - RPR - US MFM FETAL BPP WO NON STRESS; Future  2. IUFD at 20 weeks or more of gestation Follow up growth ultrasound on 3/30 Will start weekly BPP with 3/30 ultrasound - US MFM FETAL BPP WO NON STRESS;  Future  Preterm labor symptoms and general obstetric precautions including but not limited to vaginal bleeding, contractions, leaking of fluid and fetal movement were reviewed in detail with the patient. Please refer to After Visit Summary for other counseling recommendations.  Return in about 2 weeks (around 09/04/2016) for ROB.   Catalina AntiguaPeggy Tishanna Dunford, MD

## 2016-08-23 LAB — CBC
Hematocrit: 35 % (ref 34.0–46.6)
Hemoglobin: 11.7 g/dL (ref 11.1–15.9)
MCH: 28.4 pg (ref 26.6–33.0)
MCHC: 33.4 g/dL (ref 31.5–35.7)
MCV: 85 fL (ref 79–97)
PLATELETS: 324 10*3/uL (ref 150–379)
RBC: 4.12 x10E6/uL (ref 3.77–5.28)
RDW: 16.9 % — ABNORMAL HIGH (ref 12.3–15.4)
WBC: 9.5 10*3/uL (ref 3.4–10.8)

## 2016-08-23 LAB — GLUCOSE TOLERANCE, 2 HOURS W/ 1HR
GLUCOSE, 1 HOUR: 102 mg/dL (ref 65–179)
GLUCOSE, FASTING: 63 mg/dL — AB (ref 65–91)
Glucose, 2 hour: 90 mg/dL (ref 65–152)

## 2016-08-23 LAB — RPR: RPR: NONREACTIVE

## 2016-08-23 LAB — HIV ANTIBODY (ROUTINE TESTING W REFLEX): HIV SCREEN 4TH GENERATION: NONREACTIVE

## 2016-09-01 ENCOUNTER — Other Ambulatory Visit (HOSPITAL_COMMUNITY): Payer: Self-pay | Admitting: *Deleted

## 2016-09-01 ENCOUNTER — Ambulatory Visit (HOSPITAL_COMMUNITY)
Admission: RE | Admit: 2016-09-01 | Discharge: 2016-09-01 | Disposition: A | Discharge status: Home / Self Care | Payer: Medicaid Other | Source: Ambulatory Visit | Attending: Certified Nurse Midwife | Admitting: Certified Nurse Midwife

## 2016-09-01 ENCOUNTER — Encounter (HOSPITAL_COMMUNITY): Payer: Self-pay

## 2016-09-01 DIAGNOSIS — O09299 Supervision of pregnancy with other poor reproductive or obstetric history, unspecified trimester: Secondary | ICD-10-CM

## 2016-09-01 DIAGNOSIS — O0993 Supervision of high risk pregnancy, unspecified, third trimester: Secondary | ICD-10-CM | POA: Diagnosis not present

## 2016-09-01 DIAGNOSIS — Z3A28 28 weeks gestation of pregnancy: Secondary | ICD-10-CM | POA: Insufficient documentation

## 2016-09-01 DIAGNOSIS — O0990 Supervision of high risk pregnancy, unspecified, unspecified trimester: Secondary | ICD-10-CM | POA: Diagnosis present

## 2016-09-01 DIAGNOSIS — O364XX0 Maternal care for intrauterine death, not applicable or unspecified: Secondary | ICD-10-CM

## 2016-09-04 ENCOUNTER — Other Ambulatory Visit (HOSPITAL_COMMUNITY): Payer: Self-pay | Admitting: Maternal and Fetal Medicine

## 2016-09-04 ENCOUNTER — Other Ambulatory Visit: Payer: Self-pay | Admitting: Obstetrics and Gynecology

## 2016-09-04 ENCOUNTER — Encounter (HOSPITAL_COMMUNITY): Payer: Self-pay

## 2016-09-04 ENCOUNTER — Encounter: Payer: Self-pay | Admitting: Obstetrics & Gynecology

## 2016-09-04 ENCOUNTER — Ambulatory Visit (HOSPITAL_COMMUNITY)
Admission: RE | Admit: 2016-09-04 | Discharge: 2016-09-04 | Disposition: A | Discharge status: Home / Self Care | Payer: Medicaid Other | Source: Ambulatory Visit | Attending: Obstetrics and Gynecology | Admitting: Obstetrics and Gynecology

## 2016-09-04 ENCOUNTER — Ambulatory Visit (INDEPENDENT_AMBULATORY_CARE_PROVIDER_SITE_OTHER): Payer: Medicaid Other | Admitting: Obstetrics & Gynecology

## 2016-09-04 VITALS — BP 116/76 | HR 92 | Wt 151.0 lb

## 2016-09-04 DIAGNOSIS — Z3A31 31 weeks gestation of pregnancy: Secondary | ICD-10-CM

## 2016-09-04 DIAGNOSIS — O09299 Supervision of pregnancy with other poor reproductive or obstetric history, unspecified trimester: Secondary | ICD-10-CM

## 2016-09-04 DIAGNOSIS — O364XX0 Maternal care for intrauterine death, not applicable or unspecified: Secondary | ICD-10-CM

## 2016-09-04 DIAGNOSIS — O2623 Pregnancy care for patient with recurrent pregnancy loss, third trimester: Secondary | ICD-10-CM

## 2016-09-04 DIAGNOSIS — O0990 Supervision of high risk pregnancy, unspecified, unspecified trimester: Secondary | ICD-10-CM

## 2016-09-04 DIAGNOSIS — Z3A29 29 weeks gestation of pregnancy: Secondary | ICD-10-CM | POA: Diagnosis not present

## 2016-09-04 DIAGNOSIS — Z3A30 30 weeks gestation of pregnancy: Secondary | ICD-10-CM

## 2016-09-04 DIAGNOSIS — Z3A32 32 weeks gestation of pregnancy: Secondary | ICD-10-CM

## 2016-09-04 NOTE — Progress Notes (Signed)
   PRENATAL VISIT NOTE  Subjective:  Valerie Clark is a 26 y.o. G2P0010 at [redacted]w[redacted]d being seen today for ongoing prenatal care.  She is currently monitored for the following issues for this high-risk pregnancy and has Supervision of high risk pregnancy, antepartum; IUFD at 20 weeks or more of gestation; and Low serum vitamin D on her problem list.  Patient reports no complaints.  Contractions: Not present. Vag. Bleeding: None.  Movement: Present. Denies leaking of fluid.   The following portions of the patient's history were reviewed and updated as appropriate: allergies, current medications, past family history, past medical history, past social history, past surgical history and problem list. Problem list updated.  Objective:   Vitals:   09/04/16 1104  BP: 116/76  Pulse: 92  Weight: 151 lb (68.5 kg)    Fetal Status: Fetal Heart Rate (bpm): 150 Fundal Height: 28 cm Movement: Present     General:  Alert, oriented and cooperative. Patient is in no acute distress.  Skin: Skin is warm and dry. No rash noted.   Cardiovascular: Normal heart rate noted  Respiratory: Normal respiratory effort, no problems with respiration noted  Abdomen: Soft, gravid, appropriate for gestational age. Pain/Pressure: Absent     Pelvic:  Cervical exam deferred        Extremities: Normal range of motion.  Edema: None  Mental Status: Normal mood and affect. Normal behavior. Normal judgment and thought content.   Assessment and Plan:  Pregnancy: G2P0010 at [redacted]w[redacted]d  1. IUFD at 20 weeks or more of gestation Will start weekly BPP today; 2x/week testing at 32 weeks.   2. Supervision of high risk pregnancy, antepartum Normal third trimester labs.  Preterm labor symptoms and general obstetric precautions including but not limited to vaginal bleeding, contractions, leaking of fluid and fetal movement were reviewed in detail with the patient. Please refer to After Visit Summary for other counseling recommendations.   Return in about 2 weeks (around 09/18/2016) for OB Visit.  3 weeks: OB visit and NST (will have weekly OB visit and NST afterwards, and weekly BPP).   Tereso Newcomer, MD

## 2016-09-04 NOTE — Patient Instructions (Signed)
Return to clinic for any scheduled appointments or obstetric concerns, or go to [facility] for evaluation  

## 2016-09-05 ENCOUNTER — Ambulatory Visit (HOSPITAL_COMMUNITY): Payer: Medicaid Other

## 2016-09-11 ENCOUNTER — Ambulatory Visit (HOSPITAL_COMMUNITY)
Admission: RE | Admit: 2016-09-11 | Discharge: 2016-09-11 | Disposition: A | Discharge status: Home / Self Care | Payer: Medicaid Other | Source: Ambulatory Visit | Attending: Obstetrics and Gynecology | Admitting: Obstetrics and Gynecology

## 2016-09-11 ENCOUNTER — Encounter (HOSPITAL_COMMUNITY): Payer: Self-pay

## 2016-09-11 DIAGNOSIS — Z3A30 30 weeks gestation of pregnancy: Secondary | ICD-10-CM | POA: Insufficient documentation

## 2016-09-11 DIAGNOSIS — O09299 Supervision of pregnancy with other poor reproductive or obstetric history, unspecified trimester: Secondary | ICD-10-CM | POA: Diagnosis not present

## 2016-09-11 DIAGNOSIS — O0990 Supervision of high risk pregnancy, unspecified, unspecified trimester: Secondary | ICD-10-CM

## 2016-09-12 ENCOUNTER — Other Ambulatory Visit (HOSPITAL_COMMUNITY): Payer: Medicaid Other

## 2016-09-15 ENCOUNTER — Encounter (HOSPITAL_COMMUNITY): Payer: Self-pay

## 2016-09-18 ENCOUNTER — Ambulatory Visit (HOSPITAL_COMMUNITY)
Admission: RE | Admit: 2016-09-18 | Discharge: 2016-09-18 | Disposition: A | Discharge status: Home / Self Care | Payer: Medicaid Other | Source: Ambulatory Visit | Attending: Obstetrics and Gynecology | Admitting: Obstetrics and Gynecology

## 2016-09-18 ENCOUNTER — Encounter (HOSPITAL_COMMUNITY): Payer: Self-pay

## 2016-09-18 DIAGNOSIS — O09293 Supervision of pregnancy with other poor reproductive or obstetric history, third trimester: Secondary | ICD-10-CM | POA: Insufficient documentation

## 2016-09-18 DIAGNOSIS — O0990 Supervision of high risk pregnancy, unspecified, unspecified trimester: Secondary | ICD-10-CM

## 2016-09-18 DIAGNOSIS — Z3A31 31 weeks gestation of pregnancy: Secondary | ICD-10-CM | POA: Diagnosis not present

## 2016-09-18 DIAGNOSIS — O09299 Supervision of pregnancy with other poor reproductive or obstetric history, unspecified trimester: Secondary | ICD-10-CM

## 2016-09-19 ENCOUNTER — Other Ambulatory Visit (HOSPITAL_COMMUNITY): Payer: Medicaid Other

## 2016-09-21 ENCOUNTER — Encounter: Payer: Self-pay | Admitting: Obstetrics

## 2016-09-21 ENCOUNTER — Ambulatory Visit (INDEPENDENT_AMBULATORY_CARE_PROVIDER_SITE_OTHER): Payer: Medicaid Other | Admitting: Obstetrics and Gynecology

## 2016-09-21 VITALS — BP 116/79 | HR 106 | Wt 145.2 lb

## 2016-09-21 DIAGNOSIS — O09293 Supervision of pregnancy with other poor reproductive or obstetric history, third trimester: Secondary | ICD-10-CM

## 2016-09-21 DIAGNOSIS — O0993 Supervision of high risk pregnancy, unspecified, third trimester: Secondary | ICD-10-CM

## 2016-09-21 DIAGNOSIS — O364XX0 Maternal care for intrauterine death, not applicable or unspecified: Secondary | ICD-10-CM

## 2016-09-21 DIAGNOSIS — O0990 Supervision of high risk pregnancy, unspecified, unspecified trimester: Secondary | ICD-10-CM

## 2016-09-21 NOTE — Progress Notes (Signed)
   PRENATAL VISIT NOTE  Subjective:  Valerie Clark is a 26 y.o. G2P0100 at [redacted]w[redacted]d being seen today for ongoing prenatal care.  She is currently monitored for the following issues for this high-risk pregnancy and has Supervision of high risk pregnancy, antepartum; IUFD at 20 weeks or more of gestation; and Low serum vitamin D on her problem list.  Patient reports no complaints.  Contractions: Not present. Vag. Bleeding: None.  Movement: Present. Denies leaking of fluid.   The following portions of the patient's history were reviewed and updated as appropriate: allergies, current medications, past family history, past medical history, past social history, past surgical history and problem list. Problem list updated.  Objective:   Vitals:   09/21/16 1050  BP: 116/79  Pulse: (!) 106  Weight: 145 lb 3.2 oz (65.9 kg)    Fetal Status: Fetal Heart Rate (bpm): NST Fundal Height: 31 cm Movement: Present     General:  Alert, oriented and cooperative. Patient is in no acute distress.  Skin: Skin is warm and dry. No rash noted.   Cardiovascular: Normal heart rate noted  Respiratory: Normal respiratory effort, no problems with respiration noted  Abdomen: Soft, gravid, appropriate for gestational age. Pain/Pressure: Absent     Pelvic:  Cervical exam deferred        Extremities: Normal range of motion.  Edema: None  Mental Status: Normal mood and affect. Normal behavior. Normal judgment and thought content.   Assessment and Plan:  Pregnancy: G2P0100 at [redacted]w[redacted]d  1. Supervision of high risk pregnancy, antepartum Patient is doing well without complaints   2. IUFD at 20 weeks or more of gestation 8/8 BPP on 4/16. Follow up growth and BPP on 4/23 NST reviewed and reactive with baseline 140, mod variability, +accels 160, no decels  Preterm labor symptoms and general obstetric precautions including but not limited to vaginal bleeding, contractions, leaking of fluid and fetal movement were reviewed in  detail with the patient. Please refer to After Visit Summary for other counseling recommendations.  Return in about 1 week (around 09/28/2016) for ROB, NST.   Catalina Antigua, MD

## 2016-09-21 NOTE — Progress Notes (Signed)
Patient reports good fetal movement, denies pain. 

## 2016-09-25 ENCOUNTER — Ambulatory Visit (HOSPITAL_COMMUNITY)
Admission: RE | Admit: 2016-09-25 | Discharge: 2016-09-25 | Disposition: A | Discharge status: Home / Self Care | Payer: Medicaid Other | Source: Ambulatory Visit | Attending: Obstetrics and Gynecology | Admitting: Obstetrics and Gynecology

## 2016-09-25 ENCOUNTER — Encounter (HOSPITAL_COMMUNITY): Payer: Self-pay

## 2016-09-25 DIAGNOSIS — Z3A32 32 weeks gestation of pregnancy: Secondary | ICD-10-CM | POA: Diagnosis not present

## 2016-09-25 DIAGNOSIS — O09299 Supervision of pregnancy with other poor reproductive or obstetric history, unspecified trimester: Secondary | ICD-10-CM

## 2016-09-25 DIAGNOSIS — O09293 Supervision of pregnancy with other poor reproductive or obstetric history, third trimester: Secondary | ICD-10-CM | POA: Diagnosis not present

## 2016-09-26 ENCOUNTER — Other Ambulatory Visit (HOSPITAL_COMMUNITY): Payer: Medicaid Other

## 2016-09-26 ENCOUNTER — Other Ambulatory Visit (HOSPITAL_COMMUNITY): Payer: Self-pay | Admitting: *Deleted

## 2016-09-26 DIAGNOSIS — Z8759 Personal history of other complications of pregnancy, childbirth and the puerperium: Secondary | ICD-10-CM

## 2016-09-28 ENCOUNTER — Ambulatory Visit (INDEPENDENT_AMBULATORY_CARE_PROVIDER_SITE_OTHER): Payer: Medicaid Other | Admitting: Obstetrics and Gynecology

## 2016-09-28 ENCOUNTER — Encounter: Payer: Medicaid Other | Admitting: Obstetrics and Gynecology

## 2016-09-28 VITALS — BP 109/74 | HR 94 | Wt 145.5 lb

## 2016-09-28 DIAGNOSIS — O0993 Supervision of high risk pregnancy, unspecified, third trimester: Secondary | ICD-10-CM

## 2016-09-28 DIAGNOSIS — O0990 Supervision of high risk pregnancy, unspecified, unspecified trimester: Secondary | ICD-10-CM

## 2016-09-28 DIAGNOSIS — O09293 Supervision of pregnancy with other poor reproductive or obstetric history, third trimester: Secondary | ICD-10-CM

## 2016-09-28 DIAGNOSIS — O364XX0 Maternal care for intrauterine death, not applicable or unspecified: Secondary | ICD-10-CM

## 2016-09-28 NOTE — Progress Notes (Signed)
Patient reports good fetal movement, denies pain/contractions. 

## 2016-09-28 NOTE — Progress Notes (Signed)
Subjective:  Valerie Clark is a 26 y.o. G2P0100 at [redacted]w[redacted]d being seen today for ongoing prenatal care.  She is currently monitored for the following issues for this high-risk pregnancy and has Supervision of high risk pregnancy, antepartum; IUFD at 20 weeks or more of gestation; and Low serum vitamin D on her problem list.  Patient reports no complaints.  Contractions: Not present. Vag. Bleeding: None.  Movement: Present. Denies leaking of fluid.   The following portions of the patient's history were reviewed and updated as appropriate: allergies, current medications, past family history, past medical history, past social history, past surgical history and problem list. Problem list updated.  Objective:   Vitals:   09/28/16 1421  BP: 109/74  Pulse: 94  Weight: 145 lb 8 oz (66 kg)    Fetal Status: Fetal Heart Rate (bpm): NST   Movement: Present     General:  Alert, oriented and cooperative. Patient is in no acute distress.  Skin: Skin is warm and dry. No rash noted.   Cardiovascular: Normal heart rate noted  Respiratory: Normal respiratory effort, no problems with respiration noted  Abdomen: Soft, gravid, appropriate for gestational age. Pain/Pressure: Absent     Pelvic:  Cervical exam deferred        Extremities: Normal range of motion.  Edema: None  Mental Status: Normal mood and affect. Normal behavior. Normal judgment and thought content.   Urinalysis:      Assessment and Plan:  Pregnancy: G2P0100 at 103w4d  1. Supervision of high risk pregnancy, antepartum   2. IUFD at 20 weeks or more of gestation Reactive NST Will schedule weekly BPP w/o NST in MFM and weekly NST in office - Fetal nonstress test - [place] MFM FETAL BPP WO NON STRESS; Future  Preterm labor symptoms and general obstetric precautions including but not limited to vaginal bleeding, contractions, leaking of fluid and fetal movement were reviewed in detail with the patient. Please refer to After Visit Summary for  other counseling recommendations.  Return in about 1 year (around 09/28/2017) for OB visit.   Hermina Staggers, MD

## 2016-10-02 ENCOUNTER — Encounter: Payer: Medicaid Other | Admitting: Obstetrics and Gynecology

## 2016-10-02 ENCOUNTER — Ambulatory Visit (INDEPENDENT_AMBULATORY_CARE_PROVIDER_SITE_OTHER): Payer: Medicaid Other | Admitting: Obstetrics and Gynecology

## 2016-10-02 VITALS — BP 122/77 | HR 97 | Wt 145.8 lb

## 2016-10-02 DIAGNOSIS — O0990 Supervision of high risk pregnancy, unspecified, unspecified trimester: Secondary | ICD-10-CM

## 2016-10-02 DIAGNOSIS — O09293 Supervision of pregnancy with other poor reproductive or obstetric history, third trimester: Secondary | ICD-10-CM

## 2016-10-02 DIAGNOSIS — O0993 Supervision of high risk pregnancy, unspecified, third trimester: Secondary | ICD-10-CM

## 2016-10-02 DIAGNOSIS — O364XX0 Maternal care for intrauterine death, not applicable or unspecified: Secondary | ICD-10-CM

## 2016-10-02 DIAGNOSIS — O0992 Supervision of high risk pregnancy, unspecified, second trimester: Secondary | ICD-10-CM

## 2016-10-02 NOTE — Progress Notes (Signed)
   PRENATAL VISIT NOTE  Subjective:  Valerie Clark is a 26 y.o. G2P0100 at [redacted]w[redacted]d being seen today for ongoing prenatal care.  She is currently monitored for the following issues for this high-risk pregnancy and has Supervision of high risk pregnancy, antepartum; IUFD at 20 weeks or more of gestation; and Low serum vitamin D on her problem list.  Patient reports no complaints.  Contractions: Not present. Vag. Bleeding: None.  Movement: Present. Denies leaking of fluid.   The following portions of the patient's history were reviewed and updated as appropriate: allergies, current medications, past family history, past medical history, past social history, past surgical history and problem list. Problem list updated.  Objective:   Vitals:   10/02/16 1357  BP: 122/77  Pulse: 97  Weight: 145 lb 12.8 oz (66.1 kg)    Fetal Status: Fetal Heart Rate (bpm): NST Fundal Height: 33 cm Movement: Present     General:  Alert, oriented and cooperative. Patient is in no acute distress.  Skin: Skin is warm and dry. No rash noted.   Cardiovascular: Normal heart rate noted  Respiratory: Normal respiratory effort, no problems with respiration noted  Abdomen: Soft, gravid, appropriate for gestational age. Pain/Pressure: Absent     Pelvic:  Cervical exam deferred        Extremities: Normal range of motion.  Edema: None  Mental Status: Normal mood and affect. Normal behavior. Normal judgment and thought content.   Assessment and Plan:  Pregnancy: G2P0100 at [redacted]w[redacted]d  1. Supervision of high risk pregnancy in second trimester Patient is doing well without complaints Growth ultrasound already scheduled  2. IUFD at 20 weeks or more of gestation NST reviewed and reactive- baseline 130, mod variability, +accels, no decels BPP later this week Will continue fetal testing  Preterm labor symptoms and general obstetric precautions including but not limited to vaginal bleeding, contractions, leaking of fluid and  fetal movement were reviewed in detail with the patient. Please refer to After Visit Summary for other counseling recommendations.  Return in about 2 weeks (around 10/16/2016) for ROB, NST.   Catalina Antigua, MD

## 2016-10-02 NOTE — Progress Notes (Signed)
Patient reports she is doing well today. 

## 2016-10-05 ENCOUNTER — Ambulatory Visit (HOSPITAL_COMMUNITY)
Admission: RE | Admit: 2016-10-05 | Discharge: 2016-10-05 | Disposition: A | Discharge status: Home / Self Care | Payer: Medicaid Other | Source: Ambulatory Visit | Attending: Obstetrics and Gynecology | Admitting: Obstetrics and Gynecology

## 2016-10-05 DIAGNOSIS — O09293 Supervision of pregnancy with other poor reproductive or obstetric history, third trimester: Secondary | ICD-10-CM | POA: Insufficient documentation

## 2016-10-05 DIAGNOSIS — Z3A33 33 weeks gestation of pregnancy: Secondary | ICD-10-CM | POA: Diagnosis not present

## 2016-10-05 DIAGNOSIS — O364XX0 Maternal care for intrauterine death, not applicable or unspecified: Secondary | ICD-10-CM

## 2016-10-17 ENCOUNTER — Other Ambulatory Visit: Payer: Self-pay | Admitting: Obstetrics and Gynecology

## 2016-10-17 ENCOUNTER — Encounter: Payer: Self-pay | Admitting: Obstetrics & Gynecology

## 2016-10-17 ENCOUNTER — Ambulatory Visit (HOSPITAL_COMMUNITY)
Admission: RE | Admit: 2016-10-17 | Discharge: 2016-10-17 | Disposition: A | Discharge status: Home / Self Care | Payer: Medicaid Other | Source: Ambulatory Visit | Attending: Obstetrics and Gynecology | Admitting: Obstetrics and Gynecology

## 2016-10-17 ENCOUNTER — Ambulatory Visit (INDEPENDENT_AMBULATORY_CARE_PROVIDER_SITE_OTHER): Payer: Medicaid Other | Admitting: Obstetrics & Gynecology

## 2016-10-17 DIAGNOSIS — O364XX0 Maternal care for intrauterine death, not applicable or unspecified: Secondary | ICD-10-CM | POA: Diagnosis present

## 2016-10-17 DIAGNOSIS — Z3A35 35 weeks gestation of pregnancy: Secondary | ICD-10-CM | POA: Diagnosis not present

## 2016-10-17 DIAGNOSIS — O0990 Supervision of high risk pregnancy, unspecified, unspecified trimester: Secondary | ICD-10-CM

## 2016-10-17 DIAGNOSIS — O09293 Supervision of pregnancy with other poor reproductive or obstetric history, third trimester: Secondary | ICD-10-CM

## 2016-10-17 DIAGNOSIS — Z8759 Personal history of other complications of pregnancy, childbirth and the puerperium: Secondary | ICD-10-CM

## 2016-10-17 DIAGNOSIS — O09299 Supervision of pregnancy with other poor reproductive or obstetric history, unspecified trimester: Secondary | ICD-10-CM | POA: Diagnosis not present

## 2016-10-17 NOTE — Progress Notes (Signed)
   PRENATAL VISIT NOTE  Subjective:  Valerie Clark is a 26 y.o. G2P0100 at 4769w2d being seen today for ongoing prenatal care.  She is currently monitored for the following issues for this high-risk pregnancy and has Supervision of high risk pregnancy, antepartum; History of IUFD; and Low serum vitamin D on her problem list.  Patient reports no complaints.  Contractions: Not present. Vag. Bleeding: None.  Movement: Present. Denies leaking of fluid.   The following portions of the patient's history were reviewed and updated as appropriate: allergies, current medications, past family history, past medical history, past social history, past surgical history and problem list. Problem list updated.  Objective:   Vitals:   10/17/16 1328  BP: 122/78  Pulse: 84  Weight: 67.4 kg (148 lb 9.6 oz)    Fetal Status: Fetal Heart Rate (bpm): NST   Movement: Present     General:  Alert, oriented and cooperative. Patient is in no acute distress.  Skin: Skin is warm and dry. No rash noted.   Cardiovascular: Normal heart rate noted  Respiratory: Normal respiratory effort, no problems with respiration noted  Abdomen: Soft, gravid, appropriate for gestational age. Pain/Pressure: Absent     Pelvic:  Cervical exam deferred        Extremities: Normal range of motion.  Edema: None  Mental Status: Normal mood and affect. Normal behavior. Normal judgment and thought content.   Assessment and Plan:  Pregnancy: G2P0100 at 7369w2d  1. History of IUFD Fetal surveillance reassuring reactive NST  Preterm labor symptoms and general obstetric precautions including but not limited to vaginal bleeding, contractions, leaking of fluid and fetal movement were reviewed in detail with the patient. Please refer to After Visit Summary for other counseling recommendations.  1 week  Adam PhenixArnold, James G, MD

## 2016-10-17 NOTE — Patient Instructions (Addendum)

## 2016-10-23 ENCOUNTER — Encounter (HOSPITAL_COMMUNITY): Payer: Self-pay

## 2016-10-23 ENCOUNTER — Ambulatory Visit (HOSPITAL_COMMUNITY)
Admission: RE | Admit: 2016-10-23 | Discharge: 2016-10-23 | Disposition: A | Discharge status: Home / Self Care | Payer: Medicaid Other | Source: Ambulatory Visit | Attending: Obstetrics and Gynecology | Admitting: Obstetrics and Gynecology

## 2016-10-23 ENCOUNTER — Other Ambulatory Visit (HOSPITAL_COMMUNITY): Payer: Self-pay | Admitting: *Deleted

## 2016-10-23 ENCOUNTER — Other Ambulatory Visit (HOSPITAL_COMMUNITY): Payer: Self-pay | Admitting: Maternal and Fetal Medicine

## 2016-10-23 DIAGNOSIS — O0990 Supervision of high risk pregnancy, unspecified, unspecified trimester: Secondary | ICD-10-CM

## 2016-10-23 DIAGNOSIS — Z3A36 36 weeks gestation of pregnancy: Secondary | ICD-10-CM | POA: Insufficient documentation

## 2016-10-23 DIAGNOSIS — Z8759 Personal history of other complications of pregnancy, childbirth and the puerperium: Secondary | ICD-10-CM

## 2016-10-23 DIAGNOSIS — O09293 Supervision of pregnancy with other poor reproductive or obstetric history, third trimester: Secondary | ICD-10-CM | POA: Diagnosis not present

## 2016-10-24 ENCOUNTER — Ambulatory Visit (INDEPENDENT_AMBULATORY_CARE_PROVIDER_SITE_OTHER): Payer: Medicaid Other | Admitting: Obstetrics & Gynecology

## 2016-10-24 ENCOUNTER — Other Ambulatory Visit (HOSPITAL_COMMUNITY)
Admission: RE | Admit: 2016-10-24 | Discharge: 2016-10-24 | Disposition: A | Discharge status: Home / Self Care | Payer: Medicaid Other | Source: Ambulatory Visit | Attending: Obstetrics & Gynecology | Admitting: Obstetrics & Gynecology

## 2016-10-24 VITALS — BP 121/71 | HR 89 | Wt 149.9 lb

## 2016-10-24 DIAGNOSIS — Z8759 Personal history of other complications of pregnancy, childbirth and the puerperium: Secondary | ICD-10-CM

## 2016-10-24 DIAGNOSIS — Z3A00 Weeks of gestation of pregnancy not specified: Secondary | ICD-10-CM | POA: Insufficient documentation

## 2016-10-24 DIAGNOSIS — O0990 Supervision of high risk pregnancy, unspecified, unspecified trimester: Secondary | ICD-10-CM | POA: Insufficient documentation

## 2016-10-24 DIAGNOSIS — O09293 Supervision of pregnancy with other poor reproductive or obstetric history, third trimester: Secondary | ICD-10-CM

## 2016-10-24 LAB — OB RESULTS CONSOLE GC/CHLAMYDIA: GC PROBE AMP, GENITAL: NEGATIVE

## 2016-10-24 LAB — OB RESULTS CONSOLE GBS: STREP GROUP B AG: POSITIVE

## 2016-10-24 NOTE — Patient Instructions (Signed)

## 2016-10-24 NOTE — Progress Notes (Signed)
   PRENATAL VISIT NOTE  Subjective:  Valerie Clark is a 26 y.o. G2P0100 at 8474w2d being seen today for ongoing prenatal care.  She is currently monitored for the following issues for this high-risk pregnancy and has Supervision of high risk pregnancy, antepartum; History of IUFD; and Low serum vitamin D on her problem list.  Patient reports no complaints.  Contractions: Not present. Vag. Bleeding: None.  Movement: Present. Denies leaking of fluid.   The following portions of the patient's history were reviewed and updated as appropriate: allergies, current medications, past family history, past medical history, past social history, past surgical history and problem list. Problem list updated.  Objective:   Vitals:   10/24/16 1327  BP: 121/71  Pulse: 89  Weight: 149 lb 14.4 oz (68 kg)    Fetal Status:     Movement: Present     General:  Alert, oriented and cooperative. Patient is in no acute distress.  Skin: Skin is warm and dry. No rash noted.   Cardiovascular: Normal heart rate noted  Respiratory: Normal respiratory effort, no problems with respiration noted  Abdomen: Soft, gravid, appropriate for gestational age. Pain/Pressure: Absent     Pelvic:  Cervical exam performed        Extremities: Normal range of motion.  Edema: None  Mental Status: Normal mood and affect. Normal behavior. Normal judgment and thought content.   Assessment and Plan:  Pregnancy: G2P0100 at 4274w2d  1. History of IUFD NST reactive  2. Supervision of high risk pregnancy, antepartum   Preterm labor symptoms and general obstetric precautions including but not limited to vaginal bleeding, contractions, leaking of fluid and fetal movement were reviewed in detail with the patient. Please refer to After Visit Summary for other counseling recommendations.  Return in about 3 days (around 10/27/2016) for NST, visit in1 week.   Scheryl DarterJames Kavina Cantave, MD

## 2016-10-25 LAB — GC/CHLAMYDIA PROBE AMP (~~LOC~~) NOT AT ARMC
CHLAMYDIA, DNA PROBE: NEGATIVE
NEISSERIA GONORRHEA: NEGATIVE

## 2016-10-26 LAB — STREP GP B NAA: STREP GROUP B AG: POSITIVE — AB

## 2016-10-27 ENCOUNTER — Encounter: Payer: Self-pay | Admitting: Certified Nurse Midwife

## 2016-10-27 ENCOUNTER — Ambulatory Visit (INDEPENDENT_AMBULATORY_CARE_PROVIDER_SITE_OTHER): Payer: Medicaid Other | Admitting: Certified Nurse Midwife

## 2016-10-27 DIAGNOSIS — O09293 Supervision of pregnancy with other poor reproductive or obstetric history, third trimester: Secondary | ICD-10-CM

## 2016-10-27 DIAGNOSIS — Z8759 Personal history of other complications of pregnancy, childbirth and the puerperium: Secondary | ICD-10-CM

## 2016-10-27 DIAGNOSIS — O0990 Supervision of high risk pregnancy, unspecified, unspecified trimester: Secondary | ICD-10-CM

## 2016-10-27 DIAGNOSIS — B951 Streptococcus, group B, as the cause of diseases classified elsewhere: Secondary | ICD-10-CM

## 2016-10-27 DIAGNOSIS — O0993 Supervision of high risk pregnancy, unspecified, third trimester: Secondary | ICD-10-CM

## 2016-10-27 DIAGNOSIS — R7989 Other specified abnormal findings of blood chemistry: Secondary | ICD-10-CM

## 2016-10-27 NOTE — Progress Notes (Signed)
   PRENATAL VISIT NOTE  Subjective:  Valerie Clark is a 26 y.o. G2P0100 at 1938w5d being seen today for ongoing pEvelene Croonrenatal care.  She is currently monitored for the following issues for this high-risk pregnancy and has Supervision of high risk pregnancy, antepartum; History of IUFD; Low serum vitamin D; and Positive GBS test on her problem list.  Patient reports no complaints.   .  .   . Denies leaking of fluid.   The following portions of the patient's history were reviewed and updated as appropriate: allergies, current medications, past family history, past medical history, past social history, past surgical history and problem list. Problem list updated.  Objective:  There were no vitals filed for this visit.  Fetal Status:           General:  Alert, oriented and cooperative. Patient is in no acute distress.  Skin: Skin is warm and dry. No rash noted.   Cardiovascular: Normal heart rate noted  Respiratory: Normal respiratory effort, no problems with respiration noted  Abdomen: Soft, gravid, appropriate for gestational age.       Pelvic:  Cervical exam deferred        Extremities: Normal range of motion.     Mental Status: Normal mood and affect. Normal behavior. Normal judgment and thought content.  NST: + accels, no decels, moderate variability, Cat. 1 tracing. No contractions on toco.   Assessment and Plan:  Pregnancy: G2P0100 at 2238w5d  1. Supervision of high risk pregnancy, antepartum      Doing well.   Reactive NST.  Continue Prenatal testing as scheduled.    2. History of IUFD       3. Low serum vitamin D     Taking vitamin D  4. Positive GBS test     PCN for labor/delivery  Preterm labor symptoms and general obstetric precautions including but not limited to vaginal bleeding, contractions, leaking of fluid and fetal movement were reviewed in detail with the patient. Please refer to After Visit Summary for other counseling recommendations.  [facility]  Antenatal testing.   Roe Coombsachelle A Audreena Sachdeva, CNM

## 2016-10-27 NOTE — Progress Notes (Signed)
Patient presents for NST. 

## 2016-10-31 ENCOUNTER — Ambulatory Visit (HOSPITAL_COMMUNITY)
Admission: RE | Admit: 2016-10-31 | Discharge: 2016-10-31 | Disposition: A | Discharge status: Home / Self Care | Payer: Medicaid Other | Source: Ambulatory Visit | Attending: Obstetrics & Gynecology | Admitting: Obstetrics & Gynecology

## 2016-10-31 ENCOUNTER — Other Ambulatory Visit (HOSPITAL_COMMUNITY): Payer: Self-pay | Admitting: *Deleted

## 2016-10-31 ENCOUNTER — Encounter (HOSPITAL_COMMUNITY): Payer: Self-pay

## 2016-10-31 DIAGNOSIS — O09293 Supervision of pregnancy with other poor reproductive or obstetric history, third trimester: Secondary | ICD-10-CM

## 2016-10-31 DIAGNOSIS — Z3A37 37 weeks gestation of pregnancy: Secondary | ICD-10-CM | POA: Diagnosis not present

## 2016-10-31 DIAGNOSIS — O0990 Supervision of high risk pregnancy, unspecified, unspecified trimester: Secondary | ICD-10-CM

## 2016-11-01 ENCOUNTER — Ambulatory Visit (INDEPENDENT_AMBULATORY_CARE_PROVIDER_SITE_OTHER): Payer: Medicaid Other | Admitting: Obstetrics & Gynecology

## 2016-11-01 DIAGNOSIS — O0993 Supervision of high risk pregnancy, unspecified, third trimester: Secondary | ICD-10-CM

## 2016-11-01 DIAGNOSIS — O0990 Supervision of high risk pregnancy, unspecified, unspecified trimester: Secondary | ICD-10-CM

## 2016-11-01 NOTE — Patient Instructions (Signed)
Labor Induction Labor induction is when steps are taken to cause a pregnant woman to begin the labor process. Most women go into labor on their own between 37 weeks and 42 weeks of the pregnancy. When this does not happen or when there is a medical need, methods may be used to induce labor. Labor induction causes a pregnant woman's uterus to contract. It also causes the cervix to soften (ripen), open (dilate), and thin out (efface). Usually, labor is not induced before 39 weeks of the pregnancy unless there is a problem with the baby or mother. Before inducing labor, your health care provider will consider a number of factors, including the following:  The medical condition of you and the baby.  How many weeks along you are.  The status of the baby's lung maturity.  The condition of the cervix.  The position of the baby. What are the reasons for labor induction? Labor may be induced for the following reasons:  The health of the baby or mother is at risk.  The pregnancy is overdue by 1 week or more.  The water breaks but labor does not start on its own.  The mother has a health condition or serious illness, such as high blood pressure, infection, placental abruption, or diabetes.  The amniotic fluid amounts are low around the baby.  The baby is distressed. Convenience or wanting the baby to be born on a certain date is not a reason for inducing labor. What methods are used for labor induction? Several methods of labor induction may be used, such as:  Prostaglandin medicine. This medicine causes the cervix to dilate and ripen. The medicine will also start contractions. It can be taken by mouth or by inserting a suppository into the vagina.  Inserting a thin tube (catheter) with a balloon on the end into the vagina to dilate the cervix. Once inserted, the balloon is expanded with water, which causes the cervix to open.  Stripping the membranes. Your health care provider separates  amniotic sac tissue from the cervix, causing the cervix to be stretched and causing the release of a hormone called progesterone. This may cause the uterus to contract. It is often done during an office visit. You will be sent home to wait for the contractions to begin. You will then come in for an induction.  Breaking the water. Your health care provider makes a hole in the amniotic sac using a small instrument. Once the amniotic sac breaks, contractions should begin. This may still take hours to see an effect.  Medicine to trigger or strengthen contractions. This medicine is given through an IV access tube inserted into a vein in your arm. All of the methods of induction, besides stripping the membranes, will be done in the hospital. Induction is done in the hospital so that you and the baby can be carefully monitored. How long does it take for labor to be induced? Some inductions can take up to 2-3 days. Depending on the cervix, it usually takes less time. It takes longer when you are induced early in the pregnancy or if this is your first pregnancy. If a mother is still pregnant and the induction has been going on for 2-3 days, either the mother will be sent home or a cesarean delivery will be needed. What are the risks associated with labor induction? Some of the risks of induction include:  Changes in fetal heart rate, such as too high, too low, or erratic.  Fetal distress.    Chance of infection for the mother and baby.  Increased chance of having a cesarean delivery.  Breaking off (abruption) of the placenta from the uterus (rare).  Uterine rupture (very rare). When induction is needed for medical reasons, the benefits of induction may outweigh the risks. What are some reasons for not inducing labor? Labor induction should not be done if:  It is shown that your baby does not tolerate labor.  You have had previous surgeries on your uterus, such as a myomectomy or the removal of  fibroids.  Your placenta lies very low in the uterus and blocks the opening of the cervix (placenta previa).  Your baby is not in a head-down position.  The umbilical cord drops down into the birth canal in front of the baby. This could cut off the baby's blood and oxygen supply.  You have had a previous cesarean delivery.  There are unusual circumstances, such as the baby being extremely premature. This information is not intended to replace advice given to you by your health care provider. Make sure you discuss any questions you have with your health care provider. Document Released: 10/11/2006 Document Revised: 10/28/2015 Document Reviewed: 12/19/2012 Elsevier Interactive Patient Education  2017 [employer]  

## 2016-11-01 NOTE — Progress Notes (Signed)
   PRENATAL VISIT NOTE  Subjective:  Valerie Clark is a 26 y.o. G2P0100 at 959w3d being seen today for ongoing prenatal care.  She is currently monitored for the following issues for this high-risk pregnancy and has Supervision of high risk pregnancy, antepartum; History of IUFD; Low serum vitamin D; and Positive GBS test on her problem list.  Patient reports no complaints.  Contractions: Not present. Vag. Bleeding: None.  Movement: Present. Denies leaking of fluid.   The following portions of the patient's history were reviewed and updated as appropriate: allergies, current medications, past family history, past medical history, past social history, past surgical history and problem list. Problem list updated.  Objective:   Vitals:   11/01/16 1341  BP: 123/76  Pulse: 86  Weight: 150 lb (68 kg)    Fetal Status: Fetal Heart Rate (bpm): NST Fundal Height: 36 cm Movement: Present     General:  Alert, oriented and cooperative. Patient is in no acute distress.  Skin: Skin is warm and dry. No rash noted.   Cardiovascular: Normal heart rate noted  Respiratory: Normal respiratory effort, no problems with respiration noted  Abdomen: Soft, gravid, appropriate for gestational age. Pain/Pressure: Absent     Pelvic:  Cervical exam deferred        Extremities: Normal range of motion.  Edema: None  Mental Status: Normal mood and affect. Normal behavior. Normal judgment and thought content.   Assessment and Plan:  Pregnancy: G2P0100 at 669w3d  1. Supervision of high risk pregnancy, antepartum BPP 5/29 8/8 and NST reactive today, repet BPP in 6 days, IOL 6/10  Term labor symptoms and general obstetric precautions including but not limited to vaginal bleeding, contractions, leaking of fluid and fetal movement were reviewed in detail with the patient. Please refer to After Visit Summary for other counseling recommendations.  Return in about 1 week (around 11/08/2016).   Scheryl DarterJames Glennette Galster, MD

## 2016-11-06 ENCOUNTER — Telehealth (HOSPITAL_COMMUNITY): Payer: Self-pay | Admitting: *Deleted

## 2016-11-06 NOTE — Telephone Encounter (Signed)
Preadmission screen  

## 2016-11-07 ENCOUNTER — Encounter (HOSPITAL_COMMUNITY): Payer: Self-pay

## 2016-11-07 ENCOUNTER — Ambulatory Visit (HOSPITAL_COMMUNITY)
Admission: RE | Admit: 2016-11-07 | Discharge: 2016-11-07 | Disposition: A | Discharge status: Home / Self Care | Payer: Medicaid Other | Source: Ambulatory Visit | Attending: Obstetrics & Gynecology | Admitting: Obstetrics & Gynecology

## 2016-11-07 DIAGNOSIS — O09293 Supervision of pregnancy with other poor reproductive or obstetric history, third trimester: Secondary | ICD-10-CM | POA: Diagnosis not present

## 2016-11-07 DIAGNOSIS — Z3A38 38 weeks gestation of pregnancy: Secondary | ICD-10-CM | POA: Insufficient documentation

## 2016-11-07 DIAGNOSIS — O0990 Supervision of high risk pregnancy, unspecified, unspecified trimester: Secondary | ICD-10-CM

## 2016-11-08 ENCOUNTER — Ambulatory Visit (INDEPENDENT_AMBULATORY_CARE_PROVIDER_SITE_OTHER): Payer: Medicaid Other | Admitting: Obstetrics and Gynecology

## 2016-11-08 VITALS — BP 116/74 | HR 80 | Wt 152.0 lb

## 2016-11-08 DIAGNOSIS — Z8759 Personal history of other complications of pregnancy, childbirth and the puerperium: Secondary | ICD-10-CM

## 2016-11-08 DIAGNOSIS — B951 Streptococcus, group B, as the cause of diseases classified elsewhere: Secondary | ICD-10-CM

## 2016-11-08 DIAGNOSIS — O0990 Supervision of high risk pregnancy, unspecified, unspecified trimester: Secondary | ICD-10-CM

## 2016-11-08 DIAGNOSIS — O0993 Supervision of high risk pregnancy, unspecified, third trimester: Secondary | ICD-10-CM

## 2016-11-08 NOTE — Progress Notes (Signed)
Pt had us/BPP on 11/07/16, please review Pt states that Dr Debroah LoopArnold stated she would not need NST after her last visit in office. Pt advised to discuss with provider today.

## 2016-11-08 NOTE — Progress Notes (Signed)
Subjective:  Valerie Clark is a 26 y.o. G2P0100 at 5125w3d being seen today for ongoing prenatal care.  She is currently monitored for the following issues for this high-risk pregnancy and has Supervision of high risk pregnancy, antepartum; History of IUFD; Low serum vitamin D; and Positive GBS test on her problem list.  Patient reports no complaints.  Contractions: Not present. Vag. Bleeding: None.  Movement: Present. Denies leaking of fluid.   The following portions of the patient's history were reviewed and updated as appropriate: allergies, current medications, past family history, past medical history, past social history, past surgical history and problem list. Problem list updated.  Objective:   Vitals:   11/08/16 1340  BP: 116/74  Pulse: 80  Weight: 152 lb (68.9 kg)    Fetal Status: Fetal Heart Rate (bpm): 150   Movement: Present     General:  Alert, oriented and cooperative. Patient is in no acute distress.  Skin: Skin is warm and dry. No rash noted.   Cardiovascular: Normal heart rate noted  Respiratory: Normal respiratory effort, no problems with respiration noted  Abdomen: Soft, gravid, appropriate for gestational age. Pain/Pressure: Present     Pelvic:  Cervical exam deferred        Extremities: Normal range of motion.     Mental Status: Normal mood and affect. Normal behavior. Normal judgment and thought content.   Urinalysis:      Assessment and Plan:  Pregnancy: G2P0100 at 4225w3d  1. History of IUFD BPP 8/8 yesterday  2. Supervision of high risk pregnancy, antepartum Labor precautions IOL Sunday  3. Positive GBS test Tx while in labor  Term labor symptoms and general obstetric precautions including but not limited to vaginal bleeding, contractions, leaking of fluid and fetal movement were reviewed in detail with the patient. Please refer to After Visit Summary for other counseling recommendations.  Return in about 6 weeks (around 12/20/2016).   Hermina StaggersErvin, Amye Grego  L, MD

## 2016-11-08 NOTE — Patient Instructions (Signed)
Vaginal Delivery Vaginal delivery means that you will give birth by pushing your baby out of your birth canal (vagina). A team of health care providers will help you before, during, and after vaginal delivery. Birth experiences are unique for every woman and every pregnancy, and birth experiences vary depending on where you choose to give birth. What should I do to prepare for my baby's birth? Before your baby is born, it is important to talk with your health care provider about:  Your labor and delivery preferences. These may include: ? Medicines that you may be given. ? How you will manage your pain. This might include non-medical pain relief techniques or injectable pain relief such as epidural analgesia. ? How you and your baby will be monitored during labor and delivery. ? Who may be in the labor and delivery room with you. ? Your feelings about surgical delivery of your baby (cesarean delivery, or C-section) if this becomes necessary. ? Your feelings about receiving donated blood through an IV tube (blood transfusion) if this becomes necessary.  Whether you are able: ? To take pictures or videos of the birth. ? To eat during labor and delivery. ? To move around, walk, or change positions during labor and delivery.  What to expect after your baby is born, such as: ? Whether delayed umbilical cord clamping and cutting is offered. ? Who will care for your baby right after birth. ? Medicines or tests that may be recommended for your baby. ? Whether breastfeeding is supported in your hospital or birth center. ? How long you will be in the hospital or birth center.  How any medical conditions you have may affect your baby or your labor and delivery experience.  To prepare for your baby's birth, you should also:  Attend all of your health care visits before delivery (prenatal visits) as recommended by your health care provider. This is important.  Prepare your home for your baby's  arrival. Make sure that you have: ? Diapers. ? Baby clothing. ? Feeding equipment. ? Safe sleeping arrangements for you and your baby.  Install a car seat in your vehicle. Have your car seat checked by a certified car seat installer to make sure that it is installed safely.  Think about who will help you with your new baby at home for at least the first several weeks after delivery.  What can I expect when I arrive at the birth center or hospital? Once you are in labor and have been admitted into the hospital or birth center, your health care provider may:  Review your pregnancy history and any concerns you have.  Insert an IV tube into one of your veins. This is used to give you fluids and medicines.  Check your blood pressure, pulse, temperature, and heart rate (vital signs).  Check whether your bag of water (amniotic sac) has broken (ruptured).  Talk with you about your birth plan and discuss pain control options.  Monitoring Your health care provider may monitor your contractions (uterine monitoring) and your baby's heart rate (fetal monitoring). You may need to be monitored:  Often, but not continuously (intermittently).  All the time or for long periods at a time (continuously). Continuous monitoring may be needed if: ? You are taking certain medicines, such as medicine to relieve pain or make your contractions stronger. ? You have pregnancy or labor complications.  Monitoring may be done by:  Placing a special stethoscope or a handheld monitoring device on your abdomen to   check your baby's heartbeat, and feeling your abdomen for contractions. This method of monitoring does not continuously record your baby's heartbeat or your contractions.  Placing monitors on your abdomen (external monitors) to record your baby's heartbeat and the frequency and length of contractions. You may not have to wear external monitors all the time.  Placing monitors inside of your uterus  (internal monitors) to record your baby's heartbeat and the frequency, length, and strength of your contractions. ? Your health care provider may use internal monitors if he or she needs more information about the strength of your contractions or your baby's heart rate. ? Internal monitors are put in place by passing a thin, flexible wire through your vagina and into your uterus. Depending on the type of monitor, it may remain in your uterus or on your baby's head until birth. ? Your health care provider will discuss the benefits and risks of internal monitoring with you and will ask for your permission before inserting the monitors.  Telemetry. This is a type of continuous monitoring that can be done with external or internal monitors. Instead of having to stay in bed, you are able to move around during telemetry. Ask your health care provider if telemetry is an option for you.  Physical exam Your health care provider may perform a physical exam. This may include:  Checking whether your baby is positioned: ? With the head toward your vagina (head-down). This is most common. ? With the head toward the top of your uterus (head-up or breech). If your baby is in a breech position, your health care provider may try to turn your baby to a head-down position so you can deliver vaginally. If it does not seem that your baby can be born vaginally, your provider may recommend surgery to deliver your baby. In rare cases, you may be able to deliver vaginally if your baby is head-up (breech delivery). ? Lying sideways (transverse). Babies that are lying sideways cannot be delivered vaginally.  Checking your cervix to determine: ? Whether it is thinning out (effacing). ? Whether it is opening up (dilating). ? How low your baby has moved into your birth canal.  What are the three stages of labor and delivery?  Normal labor and delivery is divided into the following three stages: Stage 1  Stage 1 is the  longest stage of labor, and it can last for hours or days. Stage 1 includes: ? Early labor. This is when contractions may be irregular, or regular and mild. Generally, early labor contractions are more than 10 minutes apart. ? Active labor. This is when contractions get longer, more regular, more frequent, and more intense. ? The transition phase. This is when contractions happen very close together, are very intense, and may last longer than during any other part of labor.  Contractions generally feel mild, infrequent, and irregular at first. They get stronger, more frequent (about every 2-3 minutes), and more regular as you progress from early labor through active labor and transition.  Many women progress through stage 1 naturally, but you may need help to continue making progress. If this happens, your health care provider may talk with you about: ? Rupturing your amniotic sac if it has not ruptured yet. ? Giving you medicine to help make your contractions stronger and more frequent.  Stage 1 ends when your cervix is completely dilated to 4 inches (10 cm) and completely effaced. This happens at the end of the transition phase. Stage 2  Once   your cervix is completely effaced and dilated to 4 inches (10 cm), you may start to feel an urge to push. It is common for the body to naturally take a rest before feeling the urge to push, especially if you received an epidural or certain other pain medicines. This rest period may last for up to 1-2 hours, depending on your unique labor experience.  During stage 2, contractions are generally less painful, because pushing helps relieve contraction pain. Instead of contraction pain, you may feel stretching and burning pain, especially when the widest part of your baby's head passes through the vaginal opening (crowning).  Your health care provider will closely monitor your pushing progress and your baby's progress through the vagina during stage 2.  Your  health care provider may massage the area of skin between your vaginal opening and anus (perineum) or apply warm compresses to your perineum. This helps it stretch as the baby's head starts to crown, which can help prevent perineal tearing. ? In some cases, an incision may be made in your perineum (episiotomy) to allow the baby to pass through the vaginal opening. An episiotomy helps to make the opening of the vagina larger to allow more room for the baby to fit through.  It is very important to breathe and focus so your health care provider can control the delivery of your baby's head. Your health care provider may have you decrease the intensity of your pushing, to help prevent perineal tearing.  After delivery of your baby's head, the shoulders and the rest of the body generally deliver very quickly and without difficulty.  Once your baby is delivered, the umbilical cord may be cut right away, or this may be delayed for 1-2 minutes, depending on your baby's health. This may vary among health care providers, hospitals, and birth centers.  If you and your baby are healthy enough, your baby may be placed on your chest or abdomen to help maintain the baby's temperature and to help you bond with each other. Some mothers and babies start breastfeeding at this time. Your health care team will dry your baby and help keep your baby warm during this time.  Your baby may need immediate care if he or she: ? Showed signs of distress during labor. ? Has a medical condition. ? Was born too early (prematurely). ? Had a bowel movement before birth (meconium). ? Shows signs of difficulty transitioning from being inside the uterus to being outside of the uterus. If you are planning to breastfeed, your health care team will help you begin a feeding. Stage 3  The third stage of labor starts immediately after the birth of your baby and ends after you deliver the placenta. The placenta is an organ that develops  during pregnancy to provide oxygen and nutrients to your baby in the womb.  Delivering the placenta may require some pushing, and you may have mild contractions. Breastfeeding can stimulate contractions to help you deliver the placenta.  After the placenta is delivered, your uterus should tighten (contract) and become firm. This helps to stop bleeding in your uterus. To help your uterus contract and to control bleeding, your health care provider may: ? Give you medicine by injection, through an IV tube, by mouth, or through your rectum (rectally). ? Massage your abdomen or perform a vaginal exam to remove any blood clots that are left in your uterus. ? Empty your bladder by placing a thin, flexible tube (catheter) into your bladder. ? Encourage   you to breastfeed your baby. After labor is over, you and your baby will be monitored closely to ensure that you are both healthy until you are ready to go home. Your health care team will teach you how to care for yourself and your baby. This information is not intended to replace advice given to you by your health care provider. Make sure you discuss any questions you have with your health care provider. Document Released: 02/29/2008 Document Revised: 12/10/2015 Document Reviewed: 06/06/2015 Elsevier Interactive Patient Education  2018 [employer]  

## 2016-11-12 ENCOUNTER — Inpatient Hospital Stay (HOSPITAL_COMMUNITY)
Admission: RE | Admit: 2016-11-12 | Discharge: 2016-11-14 | DRG: 775 | Disposition: A | Discharge status: Home / Self Care | Payer: Medicaid Other | Source: Ambulatory Visit | Attending: Obstetrics & Gynecology | Admitting: Obstetrics & Gynecology

## 2016-11-12 ENCOUNTER — Encounter (HOSPITAL_COMMUNITY): Payer: Self-pay

## 2016-11-12 DIAGNOSIS — O99824 Streptococcus B carrier state complicating childbirth: Secondary | ICD-10-CM | POA: Diagnosis not present

## 2016-11-12 DIAGNOSIS — Z8759 Personal history of other complications of pregnancy, childbirth and the puerperium: Secondary | ICD-10-CM

## 2016-11-12 DIAGNOSIS — Z3493 Encounter for supervision of normal pregnancy, unspecified, third trimester: Secondary | ICD-10-CM | POA: Diagnosis present

## 2016-11-12 DIAGNOSIS — Z3A39 39 weeks gestation of pregnancy: Secondary | ICD-10-CM

## 2016-11-12 DIAGNOSIS — O0990 Supervision of high risk pregnancy, unspecified, unspecified trimester: Secondary | ICD-10-CM

## 2016-11-12 LAB — CBC
HCT: 33.4 % — ABNORMAL LOW (ref 36.0–46.0)
Hemoglobin: 11.4 g/dL — ABNORMAL LOW (ref 12.0–15.0)
MCH: 26.6 pg (ref 26.0–34.0)
MCHC: 34.1 g/dL (ref 30.0–36.0)
MCV: 77.9 fL — ABNORMAL LOW (ref 78.0–100.0)
PLATELETS: 366 10*3/uL (ref 150–400)
RBC: 4.29 MIL/uL (ref 3.87–5.11)
RDW: 14.4 % (ref 11.5–15.5)
WBC: 10.5 10*3/uL (ref 4.0–10.5)

## 2016-11-12 LAB — TYPE AND SCREEN
ABO/RH(D): B POS
ANTIBODY SCREEN: NEGATIVE

## 2016-11-12 LAB — ABO/RH: ABO/RH(D): B POS

## 2016-11-12 MED ORDER — SODIUM CHLORIDE 0.9% FLUSH: 3.0000 mL | INTRAVENOUS | Status: DC | PRN

## 2016-11-12 MED ORDER — FENTANYL CITRATE (PF) 100 MCG/2ML IJ SOLN: 100.0000 ug | Freq: Once | INTRAMUSCULAR | Status: DC

## 2016-11-12 MED ORDER — LACTATED RINGERS IV SOLN: 500.0000 mL | Freq: Once | INTRAVENOUS | Status: DC

## 2016-11-12 MED ORDER — SENNOSIDES-DOCUSATE SODIUM 8.6-50 MG PO TABS
2.0000 | ORAL_TABLET | ORAL | Status: DC
  Filled 2016-11-12: qty 2

## 2016-11-12 MED ORDER — OXYTOCIN 40 UNITS IN LACTATED RINGERS INFUSION - SIMPLE MED
2.5000 [IU]/h | INTRAVENOUS | Status: DC
  Administered 2016-11-12: 2.5 [IU]/h via INTRAVENOUS

## 2016-11-12 MED ORDER — LACTATED RINGERS IV SOLN: 500.0000 mL | INTRAVENOUS | Status: DC | PRN

## 2016-11-12 MED ORDER — DIBUCAINE 1 % RE OINT: 1.0000 "application " | TOPICAL_OINTMENT | RECTAL | Status: DC | PRN

## 2016-11-12 MED ORDER — DEXTROSE 5 % IV SOLN
5.0000 10*6.[IU] | Freq: Once | INTRAVENOUS | Status: AC
  Administered 2016-11-12: 5 10*6.[IU] via INTRAVENOUS
  Filled 2016-11-12: qty 5

## 2016-11-12 MED ORDER — TETANUS-DIPHTH-ACELL PERTUSSIS 5-2.5-18.5 LF-MCG/0.5 IM SUSP: 0.5000 mL | Freq: Once | INTRAMUSCULAR | Status: DC

## 2016-11-12 MED ORDER — WITCH HAZEL-GLYCERIN EX PADS: 1.0000 "application " | MEDICATED_PAD | CUTANEOUS | Status: DC | PRN

## 2016-11-12 MED ORDER — LIDOCAINE HCL (PF) 1 % IJ SOLN
30.0000 mL | INTRAMUSCULAR | Status: DC | PRN
  Administered 2016-11-12: 30 mL via SUBCUTANEOUS
  Filled 2016-11-12: qty 30

## 2016-11-12 MED ORDER — FENTANYL 2.5 MCG/ML BUPIVACAINE 1/10 % EPIDURAL INFUSION (WH - ANES): 14.0000 mL/h | INTRAMUSCULAR | Status: DC | PRN

## 2016-11-12 MED ORDER — SOD CITRATE-CITRIC ACID 500-334 MG/5ML PO SOLN: 30.0000 mL | ORAL | Status: DC | PRN

## 2016-11-12 MED ORDER — PENICILLIN G POT IN DEXTROSE 60000 UNIT/ML IV SOLN
3.0000 10*6.[IU] | INTRAVENOUS | Status: DC
  Administered 2016-11-12 (×2): 3 10*6.[IU] via INTRAVENOUS
  Filled 2016-11-12 (×4): qty 50

## 2016-11-12 MED ORDER — DIPHENHYDRAMINE HCL 50 MG/ML IJ SOLN: 12.5000 mg | INTRAMUSCULAR | Status: DC | PRN

## 2016-11-12 MED ORDER — ONDANSETRON HCL 4 MG PO TABS: 4.0000 mg | ORAL_TABLET | ORAL | Status: DC | PRN

## 2016-11-12 MED ORDER — PHENYLEPHRINE 40 MCG/ML (10ML) SYRINGE FOR IV PUSH (FOR BLOOD PRESSURE SUPPORT)
80.0000 ug | PREFILLED_SYRINGE | INTRAVENOUS | Status: DC | PRN
  Filled 2016-11-12: qty 5

## 2016-11-12 MED ORDER — OXYCODONE-ACETAMINOPHEN 5-325 MG PO TABS
2.0000 | ORAL_TABLET | ORAL | Status: DC | PRN
Start: 2016-11-12 — End: 2016-11-12

## 2016-11-12 MED ORDER — ACETAMINOPHEN 325 MG PO TABS: 650.0000 mg | ORAL_TABLET | ORAL | Status: DC | PRN

## 2016-11-12 MED ORDER — COCONUT OIL OIL: 1.0000 "application " | TOPICAL_OIL | Status: DC | PRN

## 2016-11-12 MED ORDER — TERBUTALINE SULFATE 1 MG/ML IJ SOLN
0.2500 mg | Freq: Once | INTRAMUSCULAR | Status: DC | PRN
  Filled 2016-11-12: qty 1

## 2016-11-12 MED ORDER — DIPHENHYDRAMINE HCL 25 MG PO CAPS: 25.0000 mg | ORAL_CAPSULE | Freq: Four times a day (QID) | ORAL | Status: DC | PRN

## 2016-11-12 MED ORDER — SODIUM CHLORIDE 0.9 % IV SOLN: 250.0000 mL | INTRAVENOUS | Status: DC | PRN

## 2016-11-12 MED ORDER — OXYTOCIN BOLUS FROM INFUSION
500.0000 mL | Freq: Once | INTRAVENOUS | Status: AC
  Administered 2016-11-12: 500 mL via INTRAVENOUS

## 2016-11-12 MED ORDER — BENZOCAINE-MENTHOL 20-0.5 % EX AERO
1.0000 "application " | INHALATION_SPRAY | CUTANEOUS | Status: DC | PRN
  Filled 2016-11-12: qty 56

## 2016-11-12 MED ORDER — EPHEDRINE 5 MG/ML INJ
10.0000 mg | INTRAVENOUS | Status: DC | PRN
  Filled 2016-11-12: qty 2

## 2016-11-12 MED ORDER — SODIUM CHLORIDE 0.9% FLUSH: 3.0000 mL | Freq: Two times a day (BID) | INTRAVENOUS | Status: DC

## 2016-11-12 MED ORDER — SIMETHICONE 80 MG PO CHEW: 80.0000 mg | CHEWABLE_TABLET | ORAL | Status: DC | PRN

## 2016-11-12 MED ORDER — FLEET ENEMA 7-19 GM/118ML RE ENEM: 1.0000 | ENEMA | RECTAL | Status: DC | PRN

## 2016-11-12 MED ORDER — MISOPROSTOL 25 MCG QUARTER TABLET
25.0000 ug | ORAL_TABLET | ORAL | Status: DC | PRN
  Administered 2016-11-12: 25 ug via VAGINAL
  Filled 2016-11-12 (×2): qty 1

## 2016-11-12 MED ORDER — PRENATAL MULTIVITAMIN CH
1.0000 | ORAL_TABLET | Freq: Every day | ORAL | Status: DC
  Administered 2016-11-13 – 2016-11-14 (×2): 1 via ORAL
  Filled 2016-11-12 (×2): qty 1

## 2016-11-12 MED ORDER — MEASLES, MUMPS & RUBELLA VAC ~~LOC~~ INJ
0.5000 mL | INJECTION | Freq: Once | SUBCUTANEOUS | Status: DC
  Filled 2016-11-12: qty 0.5

## 2016-11-12 MED ORDER — OXYTOCIN 40 UNITS IN LACTATED RINGERS INFUSION - SIMPLE MED
1.0000 m[IU]/min | INTRAVENOUS | Status: DC
  Administered 2016-11-12: 2 m[IU]/min via INTRAVENOUS
  Filled 2016-11-12: qty 1000

## 2016-11-12 MED ORDER — OXYCODONE-ACETAMINOPHEN 5-325 MG PO TABS: 1.0000 | ORAL_TABLET | ORAL | Status: DC | PRN

## 2016-11-12 MED ORDER — IBUPROFEN 600 MG PO TABS
600.0000 mg | ORAL_TABLET | Freq: Four times a day (QID) | ORAL | Status: DC
  Administered 2016-11-12 – 2016-11-14 (×7): 600 mg via ORAL
  Filled 2016-11-12 (×7): qty 1

## 2016-11-12 MED ORDER — LACTATED RINGERS IV SOLN
INTRAVENOUS | Status: DC
  Administered 2016-11-12: 09:00:00 via INTRAVENOUS

## 2016-11-12 MED ORDER — ONDANSETRON HCL 4 MG/2ML IJ SOLN: 4.0000 mg | INTRAMUSCULAR | Status: DC | PRN

## 2016-11-12 MED ORDER — ONDANSETRON HCL 4 MG/2ML IJ SOLN
4.0000 mg | Freq: Four times a day (QID) | INTRAMUSCULAR | Status: DC | PRN
  Administered 2016-11-12: 4 mg via INTRAVENOUS
  Filled 2016-11-12: qty 2

## 2016-11-12 MED ORDER — FENTANYL CITRATE (PF) 100 MCG/2ML IJ SOLN
100.0000 ug | INTRAMUSCULAR | Status: DC | PRN
  Administered 2016-11-12 (×4): 100 ug via INTRAVENOUS
  Filled 2016-11-12 (×4): qty 2

## 2016-11-12 MED ORDER — ZOLPIDEM TARTRATE 5 MG PO TABS: 5.0000 mg | ORAL_TABLET | Freq: Every evening | ORAL | Status: DC | PRN

## 2016-11-12 NOTE — Progress Notes (Signed)
Valerie Clark is a 26 y.o. G2P0100 at 1028w0d by ultrasound admitted for induction of labor due to prior IUFD.  Subjective:   Objective: BP (!) 140/109   Pulse 89   Temp 99.2 F (37.3 C) (Oral)   Resp 18   Ht 5\' 5"  (1.651 m)   Wt 152 lb (68.9 kg)   LMP 02/13/2016   BMI 25.29 kg/m  No intake/output data recorded. No intake/output data recorded.  FHT:  FHR: 130's bpm, variability: moderate,  accelerations:  Present,  decelerations:  Absent UC:   regular, every 3-5 minutes SVE:   Dilation: 3 Effacement (%): 70 Station: -1 Exam by:: Valerie EngK. Haynes, RN  Labs: Lab Results  Component Value Date   WBC 10.5 11/12/2016   HGB 11.4 (L) 11/12/2016   HCT 33.4 (L) 11/12/2016   MCV 77.9 (L) 11/12/2016   PLT 366 11/12/2016    Assessment / Plan: Induction of labor due to prior fetal demise,  progressing well on pitocin  Labor: Progressing normally Preeclampsia:  no signs or symptoms of toxicity Fetal Wellbeing:  Category I Pain Control:  Epidural I/D:  n/a Anticipated MOD:  NSVD  Valerie Clark 11/12/2016, 5:07 PM

## 2016-11-12 NOTE — Progress Notes (Signed)
Teniya Evelene CroonKaur is a 26 y.o. G2P0100 at 876w0d by ultrasound admitted for induction of labor due to hx of of IUFD.  Subjective:   Objective: BP 127/75   Pulse 83   Temp 98.3 F (36.8 C) (Oral)   Resp 18   Ht 5\' 5"  (1.651 m)   Wt 152 lb (68.9 kg)   LMP 02/13/2016   BMI 25.29 kg/m  No intake/output data recorded. No intake/output data recorded.  FHT:  FHR: 130's bpm, variability: moderate,  accelerations:  Present,  decelerations:  Absent UC:   regular, every 2 minutes SVE:   Dilation: 3 Effacement (%): 70 Station: -1 Exam by:: Delsa Bernarlene Lawso CNM  Labs: Lab Results  Component Value Date   WBC 10.5 11/12/2016   HGB 11.4 (L) 11/12/2016   HCT 33.4 (L) 11/12/2016   MCV 77.9 (L) 11/12/2016   PLT 366 11/12/2016    Assessment / Plan: Induction of labor due to prev IUFD,  progressing well on pitocin  Labor: Progressing normally Preeclampsia:  no signs or symptoms of toxicity Fetal Wellbeing:  Category I Pain Control:  Labor support without medications I/D:  n/a Anticipated MOD:  NSVD  Wyvonnia DuskyMarie Kensli Bowley 11/12/2016, 12:38 PM

## 2016-11-12 NOTE — Anesthesia Pain Management Evaluation Note (Signed)
  CRNA Pain Management Visit Note  Patient: Valerie Clark, 26 y.o., female  "Hello [facility] am [facility]. We have an anesthesia team available at all times to provide care throughout the hospital, including epidural management and anesthesia for C-section. I don't know your plan for the delivery whether it a natural birth, water birth, IV sedation, nitrous supplementation, doula or epidural, but we want to meet your pain goals."   1.Was your pain managed to your expectations on prior hospitalizations?   No prior hospitalizations  2.What is your expectation for pain management during this hospitalization?     Epidural  3.How can we help you reach that goal? epidural  Record the patient's initial score and the patient's pain goal.   Pain: 6  Pain Goal: 8 [facility] wants you to be able to say your pain was always managed very well.  Valerie Clark,Valerie Clark 11/12/2016

## 2016-11-12 NOTE — H&P (Signed)
Valerie Clark is a 26 y.o. female G2P0100 @ 39 wks presenting for IOL for prev IUFD. OB History    Gravida Para Term Preterm AB Living   2 1   1  0 0   SAB TAB Ectopic Multiple Live Births   0             Past Medical History:  Diagnosis Date  . Medical history non-contributory    Past Surgical History:  Procedure Laterality Date  . NO PAST SURGERIES     Family History: family history is not on file. Social History:  reports that she has never smoked. She has never used smokeless tobacco. She reports that she does not drink alcohol or use drugs.     Maternal Diabetes: No Genetic Screening: Normal Maternal Ultrasounds/Referrals: Normal Fetal Ultrasounds or other Referrals:  None Maternal Substance Abuse:  No Significant Maternal Medications:  None Significant Maternal Lab Results:  Lab values include: Group B Strep positive Other Comments:  None  Review of Systems  Constitutional: Negative.   HENT: Negative.   Eyes: Negative.   Respiratory: Negative.   Cardiovascular: Negative.   Gastrointestinal: Negative.   Genitourinary: Negative.   Musculoskeletal: Negative.   Skin: Negative.   Neurological: Negative.   Endo/Heme/Allergies: Negative.   Psychiatric/Behavioral: Negative.    Maternal Medical History:  Reason for admission: IOL for prev IUFD  Contractions: none  Fetal activity: Perceived fetal activity is normal.   Last perceived fetal movement was within the past hour.    Prenatal complications: no prenatal complications Prenatal Complications - Diabetes: none.    Dilation: Fingertip Effacement (%): Thick Station: Ballotable Exam by:: Camelia EngK. Haynes, RN Height 5\' 5"  (1.651 m), weight 152 lb (68.9 kg), last menstrual period 02/13/2016. Maternal Exam:  Abdomen: Patient reports no abdominal tenderness. Estimated fetal weight is 7.0 lbs.   Fetal presentation: vertex  Introitus: Normal vulva. Normal vagina.  Amniotic fluid character: not assessed.  Pelvis:  adequate for delivery.   Cervix: Cervix evaluated by digital exam.     Fetal Exam Fetal Monitor Review: Mode: ultrasound.   Variability: moderate (6-25 bpm).   Pattern: accelerations present.    Fetal State Assessment: Category I - tracings are normal.     Physical Exam  Constitutional: She is oriented to person, place, and time. She appears well-developed and well-nourished.  HENT:  Head: Normocephalic.  Neck: Normal range of motion.  Cardiovascular: Normal rate, regular rhythm, normal heart sounds and intact distal pulses.   Respiratory: Effort normal and breath sounds normal.  GI: Soft. Bowel sounds are normal.  Genitourinary: Vagina normal and uterus normal.  Musculoskeletal: Normal range of motion.  Neurological: She is alert and oriented to person, place, and time. She has normal reflexes.  Skin: Skin is warm and dry.  Psychiatric: She has a normal mood and affect. Her behavior is normal. Judgment and thought content normal.    Prenatal labs: ABO, Rh: B/Positive/-- (12/01 1239) Antibody: Negative (12/01 1239) Rubella: 23.30 (12/01 1239) RPR: Non Reactive (03/19 1130)  HBsAg: Negative (12/01 1239)  HIV: Non Reactive (03/19 1130)  GBS: Positive (05/22 1442)   Assessment/Plan: IOL for prev IUFD GBS SVE ft/th/post/blts/vtx Plan cytotec induction of labor GBS prophylaxis    Wyvonnia DuskyMarie Keatyn Jawad 11/12/2016, 9:36 AM

## 2016-11-13 LAB — RPR: RPR: NONREACTIVE

## 2016-11-13 NOTE — Lactation Note (Signed)
This note was copied from a baby's chart. Lactation Consultation Note  Patient Name: Valerie Clark Today's Date: 11/13/2016 Reason for consult: Initial assessment Baby at 16 hr of life. Upon entry baby was in basinet fussing. Baby had spit up, had a wet, and stool diaper. After cleaning baby up, mom was agreeable to latch help. Mom has semi firm breast with everted nipples. Baby has a narrow gape but with a little jaw massage and cross cradle positioning instead of cradle hold, baby was able to get a deeper latch. Mom is reporting sore nipples, no skin break down noted. Discussed baby behavior, feeding frequency, baby belly size, voids, wt loss, breast changes, and nipple care. Demonstrated manual expression, colostrum noted bilaterally, spoon in room. Given lactation handouts. Aware of OP services and support group.     Maternal Data Has patient been taught Hand Expression?: Yes Does the patient have breastfeeding experience prior to this delivery?: No  Feeding Feeding Type: Breast Fed  LATCH Score/Interventions Latch: Grasps breast easily, tongue down, lips flanged, rhythmical sucking. Intervention(s): Adjust position;Assist with latch  Audible Swallowing: A few with stimulation Intervention(s): Skin to skin;Hand expression  Type of Nipple: Everted at rest and after stimulation  Comfort (Breast/Nipple): Filling, red/small blisters or bruises, mild/mod discomfort  Problem noted: Mild/Moderate discomfort Interventions (Mild/moderate discomfort): Hand expression  Hold (Positioning): Full assist, staff holds infant at breast Intervention(s): Position options;Support Pillows  LATCH Score: 6  Lactation Tools Discussed/Used     Consult Status Consult Status: Follow-up Date: 11/14/16 Follow-up type: In-patient    [name] 11/13/2016, 11:05 AM

## 2016-11-13 NOTE — Progress Notes (Signed)
POSTPARTUM PROGRESS NOTE  Post Partum Day #1  Subjective:  Valerie Clark is a 26 y.o. G2P1101 876w0d s/p SVD.  No acute events overnight.  Pt denies problems with ambulating, voiding or po intake.  She denies nausea or vomiting.  Pain is well controlled. Lochia Minimal.   Objective: Blood pressure 116/73, pulse 80, temperature 98.2 F (36.8 C), resp. rate 18, height 5\' 5"  (1.651 m), weight 68.9 kg (152 lb), last menstrual period 02/13/2016, SpO2 96 %, unknown if currently breastfeeding.  Physical Exam:  General: alert, cooperative and no distress Lochia:normal flow Uterine Fundus: firm DVT Evaluation: No calf swelling or tenderness Extremities: no edema   Recent Labs  11/12/16 0855  HGB 11.4*  HCT 33.4*    Assessment/Plan:  ASSESSMENT: Valerie Clark is a 26 y.o. G2P1101 6476w0d s/p SVD  Plan for discharge tomorrow   LOS: 1 day   Howard PouchLauren Feng, MD PGY-1 [facility]  11/13/2016, 11:09 AM   OB FELLOW POSTPARTUM PROGRESS NOTE ATTESTATION  I confirm that I have verified the information documented in the resident's note and that I have also personally reperformed the physical exam and all medical decision making activities.     Ernestina PennaNicholas Gretta Samons, MD 9:31 AM

## 2016-11-13 NOTE — Progress Notes (Signed)
UR chart review completed.  

## 2016-11-14 ENCOUNTER — Ambulatory Visit: Payer: Self-pay

## 2016-11-14 ENCOUNTER — Ambulatory Visit (HOSPITAL_COMMUNITY): Payer: Medicaid Other

## 2016-11-14 MED ORDER — IBUPROFEN 600 MG PO TABS: 600.0000 mg | ORAL_TABLET | Freq: Four times a day (QID) | ORAL | 0 refills | Status: DC

## 2016-11-14 MED ORDER — DOCUSATE SODIUM 100 MG PO CAPS: 100.0000 mg | ORAL_CAPSULE | Freq: Two times a day (BID) | ORAL | 0 refills | Status: DC

## 2016-11-14 NOTE — Discharge Instructions (Signed)

## 2016-11-14 NOTE — Lactation Note (Signed)
This note was copied from a baby's chart. Lactation Consultation Note: Assist mother with latching infant on in cross cradle hold. Mother hand expresses good flow of colostrum. Mothers breast are filling. Infant latched with good depth. Advised mother to listen for swallows. Mother to breastfeed infant 8-12 times in 24 hours and to feed infant on cue. Mother was given a hand pump with instructions to use to pre pump. Mother advised to do good breast massage to prevent engorgement . Mother receptive to all teaching. Advised mother to [facility] or [facility].   Patient Name: Valerie Clark ZHGDJ'MToday's Date: 11/14/2016     Maternal Data    Feeding    LATCH Score/Interventions                      Lactation Tools Discussed/Used     Consult Status      Valerie Clark, Valerie Clark 11/14/2016, 3:12 PM

## 2016-11-14 NOTE — Progress Notes (Signed)
Post Partum Day #2 Subjective: no complaints, up ad lib, voiding and tolerating PO  Objective: Blood pressure 124/72, pulse 78, temperature 98.1 F (36.7 C), temperature source Oral, resp. rate 18, height 5\' 5"  (1.651 m), weight 152 lb (68.9 kg), last menstrual period 02/13/2016, SpO2 96 %, unknown if currently breastfeeding.  Physical Exam:  General: alert, cooperative and no distress Lochia: appropriate Uterine Fundus: firm Incision: 2nd deg lac; healing DVT Evaluation: No evidence of DVT seen on physical exam. No cords or calf tenderness. No significant calf/ankle edema.   Recent Labs  11/12/16 0855  HGB 11.4*  HCT 33.4*    Assessment/Plan: Discharge home, Breastfeeding and Contraception declined.   LOS: 2 days   Valerie Clark, CNM 11/14/2016, 7:41 AM

## 2016-11-14 NOTE — Plan of Care (Signed)
Problem: Nutritional: Goal: Mothers verbalization of comfort with breastfeeding process will improve Outcome: Completed/Met Date Met: 11/14/16 Lactation and nursing staff have assisted mother with breastfeeding. Mother reports feeling confident about latching, milk volume increasing and has community resources for lactation support following discharge.

## 2016-11-14 NOTE — Discharge Summary (Signed)
OB Discharge Summary     Patient Name: Valerie Clark DOB: 05/14/1991 MRN: [id number]  Date of admission: 11/12/2016 Delivering MD: Zerita BoersLAWSON, DARLENE   Date of discharge: 11/14/2016  Admitting diagnosis: 39WKS INDUCTION  Intrauterine pregnancy: 517w0d     Secondary diagnosis:  Principal Problem:   NSVD (normal spontaneous vaginal delivery) Active Problems:   History of IUFD  Additional problems: none     Discharge diagnosis: Term Pregnancy Delivered                                                                                                Post partum procedures:none  Augmentation: AROM and Pitocin  Complications: None  Hospital course:  Induction of Labor With Vaginal Delivery   26 y.o. yo G2P1101 at 537w0d was admitted to the hospital 11/12/2016 for induction of labor.  Indication for induction: hx of IUFD.  Patient had an uncomplicated labor course as follows: Membrane Rupture Time/Date: 12:36 PM ,11/12/2016   Intrapartum Procedures: Episiotomy: None [1]                                         Lacerations:  2nd degree [3]  Patient had delivery of a Viable infant.  Information for the patient's newborn:  Aldona BarKaur, Girl Yazmyn [[id number]][[id number]]  Delivery Method: Vag-Spont   11/12/2016  Details of delivery can be found in separate delivery note.  Patient had a routine postpartum course. Patient is discharged home 11/14/16.  Physical exam  Vitals:   11/13/16 0216 11/13/16 1000 11/13/16 1901 11/14/16 0600  BP: (!) 107/59 116/73 119/69 124/72  Pulse: 84 80 76 78  Resp: 18 18 18 18   Temp: 99.2 F (37.3 C) 98.2 F (36.8 C) 98.3 F (36.8 C) 98.1 F (36.7 C)  TempSrc: Oral  Oral Oral  SpO2: 98% 96%    Weight:      Height:       General: alert, cooperative and no distress Lochia: appropriate Uterine Fundus: firm Incision: 2nd deg lac; healing DVT Evaluation: No evidence of DVT seen on physical exam. No cords or calf tenderness. No significant calf/ankle edema. Labs: Lab  Results  Component Value Date   WBC 10.5 11/12/2016   HGB 11.4 (L) 11/12/2016   HCT 33.4 (L) 11/12/2016   MCV 77.9 (L) 11/12/2016   PLT 366 11/12/2016   CMP Latest Ref Rng & Units 04/06/2016  Glucose 65 - 99 mg/dL 93  BUN 6 - 20 mg/dL 7  Creatinine 0.860.44 - 5.781.00 mg/dL 4.690.52  Sodium 629135 - 528145 mmol/L 135  Potassium 3.5 - 5.1 mmol/L 3.6  Chloride 101 - 111 mmol/L 105  CO2 22 - 32 mmol/L 24  Calcium 8.9 - 10.3 mg/dL 9.3    Discharge instruction: per After Visit Summary and "Baby and Me Booklet".  After visit meds:  Allergies as of 11/14/2016   No Known Allergies     Medication List    STOP taking these medications   Doxylamine-Pyridoxine 10-10 MG Tbec Commonly known as:  DICLEGIS  TAKE these medications   docusate sodium 100 MG capsule Commonly known as:  COLACE Take 1 capsule (100 mg total) by mouth 2 (two) times daily.   ibuprofen 600 MG tablet Commonly known as:  ADVIL,MOTRIN Take 1 tablet (600 mg total) by mouth every 6 (six) hours.   OB COMPLETE PETITE 35-5-1-200 MG Caps Take 1 tablet by mouth daily.   Vitamin D (Ergocalciferol) 50000 units Caps capsule Commonly known as:  DRISDOL Take 1 capsule (50,000 Units total) by mouth every 7 (seven) days. What changed:  additional instructions       Diet: routine diet  Activity: Advance as tolerated. Pelvic rest for 6 weeks.   Outpatient follow up:6 weeks Follow up Appt:No future appointments. Follow up Visit:No Follow-up on file.  Postpartum contraception: Natural Family Planning and None  Newborn Data: Live born female  Birth Weight: 6 lb 13.7 oz (3110 g) APGAR: 9, 9  Baby Feeding: Breast Disposition:home with mother   11/14/2016 Roe Coombs, CNM

## 2016-12-11 ENCOUNTER — Ambulatory Visit (INDEPENDENT_AMBULATORY_CARE_PROVIDER_SITE_OTHER): Payer: Medicaid Other | Admitting: Obstetrics and Gynecology

## 2016-12-11 ENCOUNTER — Encounter: Payer: Self-pay | Admitting: Obstetrics and Gynecology

## 2016-12-11 MED ORDER — NORETHINDRONE 0.35 MG PO TABS: 1.0000 | ORAL_TABLET | Freq: Every day | ORAL | 11 refills | Status: DC

## 2016-12-11 NOTE — Progress Notes (Signed)
Post Partum Exam  Valerie Clark is a 26 y.o. 152P1101 female who presents for a postpartum visit. She is 4 weeks postpartum following a spontaneous vaginal delivery. I have fully reviewed the prenatal and intrapartum course. The delivery was at 39 gestational weeks.  Anesthesia: none. Postpartum course has been unremarkable. Baby's course has been unremarkable. Baby is feeding by both breast and bottle - Similac Advance. Bleeding no bleeding. Bowel function is39 normal. Bladder function is normal. Patient is not sexually active. Contraception method is none. Postpartum depression screening:neg  The following portions of the patient's history were reviewed and updated as appropriate: allergies, current medications, past family history, past medical history, past social history and past surgical history.  Review of Systems Pertinent items are noted in HPI.    Objective:  unknown if currently breastfeeding.  General:  alert   Breasts:  deferred  Lungs: clear to auscultation bilaterally  Heart:  regular rate and rhythm, S1, S2 normal, no murmur, click, rub or gallop  Abdomen: soft, non-tender; bowel sounds normal; no masses,  no organomegaly   Vulva:  not evaluated  Vagina: not evaluated  Cervix:  not evalauted  Corpus: not examined  Adnexa:  not evaluated  Rectal Exam: Normal rectovaginal exam        Assessment:    Normal postpartum exam. Pap smear 12/17  Plan:   1. Contraception: oral progesterone-only contraceptive U/R/B and back up method reviewed. 2. Return to nl ADL's 3. Follow up in: 1 year or as needed.

## 2016-12-11 NOTE — Patient Instructions (Signed)

## 2017-10-23 ENCOUNTER — Inpatient Hospital Stay (HOSPITAL_COMMUNITY)
Admission: AD | Admit: 2017-10-23 | Discharge: 2017-10-23 | Disposition: A | Discharge status: Home / Self Care | Payer: BLUE CROSS/BLUE SHIELD | Source: Ambulatory Visit | Attending: Obstetrics and Gynecology | Admitting: Obstetrics and Gynecology

## 2017-10-23 ENCOUNTER — Encounter (HOSPITAL_COMMUNITY): Payer: Self-pay

## 2017-10-23 ENCOUNTER — Inpatient Hospital Stay (HOSPITAL_COMMUNITY): Payer: BLUE CROSS/BLUE SHIELD

## 2017-10-23 DIAGNOSIS — O3680X0 Pregnancy with inconclusive fetal viability, not applicable or unspecified: Secondary | ICD-10-CM

## 2017-10-23 DIAGNOSIS — O209 Hemorrhage in early pregnancy, unspecified: Secondary | ICD-10-CM | POA: Insufficient documentation

## 2017-10-23 DIAGNOSIS — N939 Abnormal uterine and vaginal bleeding, unspecified: Secondary | ICD-10-CM | POA: Diagnosis present

## 2017-10-23 DIAGNOSIS — O4691 Antepartum hemorrhage, unspecified, first trimester: Secondary | ICD-10-CM | POA: Diagnosis not present

## 2017-10-23 DIAGNOSIS — Z6790 Unspecified blood type, Rh positive: Secondary | ICD-10-CM | POA: Diagnosis not present

## 2017-10-23 DIAGNOSIS — Z3A01 Less than 8 weeks gestation of pregnancy: Secondary | ICD-10-CM | POA: Insufficient documentation

## 2017-10-23 DIAGNOSIS — O4690 Antepartum hemorrhage, unspecified, unspecified trimester: Secondary | ICD-10-CM

## 2017-10-23 LAB — URINALYSIS, ROUTINE W REFLEX MICROSCOPIC
Bilirubin Urine: NEGATIVE
GLUCOSE, UA: NEGATIVE mg/dL
Ketones, ur: NEGATIVE mg/dL
Nitrite: NEGATIVE
PH: 5 (ref 5.0–8.0)
Protein, ur: NEGATIVE mg/dL
SPECIFIC GRAVITY, URINE: 1.02 (ref 1.005–1.030)

## 2017-10-23 LAB — WET PREP, GENITAL
CLUE CELLS WET PREP: NONE SEEN
SPERM: NONE SEEN
TRICH WET PREP: NONE SEEN
YEAST WET PREP: NONE SEEN

## 2017-10-23 LAB — CBC
HEMATOCRIT: 39.4 % (ref 36.0–46.0)
HEMOGLOBIN: 13.7 g/dL (ref 12.0–15.0)
MCH: 27.4 pg (ref 26.0–34.0)
MCHC: 34.8 g/dL (ref 30.0–36.0)
MCV: 78.8 fL (ref 78.0–100.0)
Platelets: 342 10*3/uL (ref 150–400)
RBC: 5 MIL/uL (ref 3.87–5.11)
RDW: 14.6 % (ref 11.5–15.5)
WBC: 6.2 10*3/uL (ref 4.0–10.5)

## 2017-10-23 LAB — HCG, QUANTITATIVE, PREGNANCY: hCG, Beta Chain, Quant, S: 21 m[IU]/mL — ABNORMAL HIGH (ref <5)

## 2017-10-23 LAB — POCT PREGNANCY, URINE: Preg Test, Ur: POSITIVE — AB

## 2017-10-23 NOTE — Discharge Instructions (Signed)
Ectopic Pregnancy An ectopic pregnancy happens when a fertilized egg grows outside the uterus. A pregnancy cannot live outside of the uterus. This problem often happens in the fallopian tube. It is often caused by damage to the fallopian tube. If this problem is found early, you may be treated with medicine. If your tube tears or bursts open (ruptures), you will bleed inside. This is an emergency. You will need surgery. Get help right away. What are the signs or symptoms? You may have normal pregnancy symptoms at first. These include:  Missing your period.  Feeling sick to your stomach (nauseous).  Being tired.  Having tender breasts.  Then, you may start to have symptoms that are not normal. These include:  Pain with sex (intercourse).  Bleeding from the vagina. This includes light bleeding (spotting).  Belly (abdomen) or lower belly cramping or pain. This may be felt on one side.  A fast heartbeat (pulse).  Passing out (fainting) after going poop (bowel movement).  If your tube tears, you may have symptoms such as:  Really bad pain in the belly or lower belly. This happens suddenly.  Dizziness.  Passing out.  Shoulder pain.  Get help right away if: You have any of these symptoms. This is an emergency. This information is not intended to replace advice given to you by your health care provider. Make sure you discuss any questions you have with your health care provider. Document Released: 08/18/2008 Document Revised: 10/28/2015 Document Reviewed: 01/01/2013 Elsevier Interactive Patient Education  2017 [employer].    Miscarriage A miscarriage is the loss of an unborn baby (fetus) before the 20th week of pregnancy. The cause is often unknown. Follow these instructions at home:  You may need to stay in bed (bed rest), or you may be able to do light activity. Go about activity as told by your doctor.  Have help at home.  Write down how many pads you use each day.  Write down how soaked they are.  Do not use tampons. Do not wash out your vagina (douche) or have sex (intercourse) until your doctor approves.  Only take medicine as told by your doctor.  Do not take aspirin.  Keep all doctor visits as told.  If you or your partner have problems with grieving, talk to your doctor. You can also try counseling. Give yourself time to grieve before trying to get pregnant again. Get help right away if:  You have bad cramps or pain in your back or belly (abdomen).  You have a fever.  You pass large clumps of blood (clots) from your vagina that are walnut-sized or larger. Save the clumps for your doctor to see.  You pass large amounts of tissue from your vagina. Save the tissue for your doctor to see.  You have more bleeding.  You have thick, bad-smelling fluid (discharge) coming from the vagina.  You get lightheaded, weak, or you pass out (faint).  You have chills. This information is not intended to replace advice given to you by your health care provider. Make sure you discuss any questions you have with your health care provider. Document Released: 08/14/2011 Document Revised: 10/28/2015 Document Reviewed: 06/22/2011 Elsevier Interactive Patient Education  2017 [employer].

## 2017-10-23 NOTE — MAU Provider Note (Addendum)
History     CSN: [id number]  Arrival date and time: 10/23/17 1017   First Provider Initiated Contact with Patient 10/23/17 1055      Chief Complaint  Patient presents with  . Vaginal Bleeding   G3P1101 .0 wks here with VB. Bleeding started last night. Describes as bright red. Saturated a pad over several hrs. Passed a clot in the toilet. Had some abd pain last night but none today. No fevers.    OB History    Gravida  3   Para  2   Term  1   Preterm  1   AB  0   Living  1     SAB  0   TAB      Ectopic      Multiple  0   Live Births  1           Past Medical History:  Diagnosis Date  . Medical history non-contributory     Past Surgical History:  Procedure Laterality Date  . NO PAST SURGERIES      History reviewed. No pertinent family history.  Social History   Tobacco Use  . Smoking status: Never Smoker  . Smokeless tobacco: Never Used  Substance Use Topics  . Alcohol use: No  . Drug use: No    Allergies: No Known Allergies  No medications prior to admission.    Review of Systems  Constitutional: Negative for chills and fever.  Gastrointestinal: Negative for abdominal pain.  Genitourinary: Positive for vaginal bleeding.   Physical Exam   Blood pressure 136/90, pulse (!) 103, temperature 98.8 F (37.1 C), temperature source Oral, resp. rate 18, height  (1.626 m), weight 152 lb (68.9 kg), last menstrual period 09/11/2017, currently breastfeeding.  Physical Exam  Constitutional: She is oriented to person, place, and time. She appears well-developed and well-nourished. No distress.  HENT:  Head: Normocephalic and atraumatic.  Neck: Normal range of motion.  Respiratory: Effort normal. No respiratory distress.  Genitourinary:  Genitourinary Comments: External: no lesions or erythema Vagina: rugated, pink, moist, small amt drk red bloody discharge Uterus: non enlarged, anteverted, non tender, no CMT Adnexae: no masses, no  tenderness left, no tenderness right Cervix closed   Musculoskeletal: Normal range of motion.  Neurological: She is alert and oriented to person, place, and time.  Skin: Skin is warm and dry.  Psychiatric: She has a normal mood and affect.   Results for orders placed or performed during the hospital encounter of 10/23/17 (from the past 24 hour(s))  Urinalysis, Routine w reflex microscopic     Status: Abnormal   Collection Time: 10/23/17 10:30 AM  Result Value Ref Range   Color, Urine YELLOW YELLOW   APPearance CLEAR CLEAR   Specific Gravity, Urine 1.020 1.005 - 1.030   pH 5.0 5.0 - 8.0   Glucose, UA NEGATIVE NEGATIVE mg/dL   Hgb urine dipstick LARGE (A) NEGATIVE   Bilirubin Urine NEGATIVE NEGATIVE   Ketones, ur NEGATIVE NEGATIVE mg/dL   Protein, ur NEGATIVE NEGATIVE mg/dL   Nitrite NEGATIVE NEGATIVE   Leukocytes, UA TRACE (A) NEGATIVE   RBC / HPF 21-50 0 - 5 RBC/hpf   WBC, UA 6-10 0 - 5 WBC/hpf   Bacteria, UA RARE (A) NONE SEEN   Squamous Epithelial / LPF 0-5 0 - 5   Mucus PRESENT   Pregnancy, urine POC     Status: Abnormal   Collection Time: 10/23/17 10:47 AM  Result Value Ref Range  Preg Test, Ur POSITIVE (A) NEGATIVE  Wet prep, genital     Status: Abnormal   Collection Time: 10/23/17 11:05 AM  Result Value Ref Range   Yeast Wet Prep HPF POC NONE SEEN NONE SEEN   Trich, Wet Prep NONE SEEN NONE SEEN   Clue Cells Wet Prep HPF POC NONE SEEN NONE SEEN   WBC, Wet Prep HPF POC FEW (A) NONE SEEN   Sperm NONE SEEN   CBC     Status: None   Collection Time: 10/23/17 11:21 AM  Result Value Ref Range   WBC 6.2 4.0 - 10.5 K/uL   RBC 5.00 3.87 - 5.11 MIL/uL   Hemoglobin 13.7 12.0 - 15.0 g/dL   HCT 81.1 91.4 - 78.2 %   MCV 78.8 78.0 - 100.0 fL   MCH 27.4 26.0 - 34.0 pg   MCHC 34.8 30.0 - 36.0 g/dL   RDW 95.6 21.3 - 08.6 %   Platelets 342 150 - 400 K/uL  hCG, quantitative, pregnancy     Status: Abnormal   Collection Time: 10/23/17 11:21 AM  Result Value Ref Range   hCG, Beta  Chain, Quant, S 21 (H) <5 mIU/mL   US Ob Less Than 14 Weeks With Ob Transvaginal  Result Date: 10/23/2017 CLINICAL DATA:  Early pregnancy, vaginal bleeding, quantitative beta HCG = 21; EGA [redacted] weeks 0 days by LMP of 09/11/2017 EXAM: OBSTETRIC <14 WK [place] AND TRANSVAGINAL OB US TECHNIQUE: Both transabdominal and transvaginal ultrasound examinations were performed for complete evaluation of the gestation as well as the maternal uterus, adnexal regions, and pelvic cul-de-sac. Transvaginal technique was performed to assess early pregnancy. COMPARISON:  None for this gestation FINDINGS: Intrauterine gestational sac: None visualized Yolk sac:  N/A Embryo:  N/A Cardiac Activity: N/A Heart Rate: N/A  bpm MSD:   mm    w     d CRL:    mm    w    d                  [place] EDC: Subchorionic hemorrhage:  N/A Maternal uterus/adnexae: Uterus normal size and morphology. No uterine mass, endometrial fluid or gestational sac. RIGHT ovary normal size and morphology, 3.4 x 1.8 x 2.0 cm. LEFT ovary normal size and morphology 2.7 x 1.6 x 1.9 cm. Trace free pelvic fluid in LEFT adnexa. No adnexal masses. IMPRESSION: No intrauterine gestation identified. Findings are compatible with pregnancy of unknown location. Differential diagnosis includes early intrauterine pregnancy too early to visualize, spontaneous abortion, and ectopic pregnancy. Serial quantitative beta hCG and or followup ultrasound recommended to definitively exclude ectopic pregnancy. Electronically Signed   By: Ulyses Southward M.D.   On: 10/23/2017 12:33    MAU Course  Procedures  MDM Labs and [place] ordered and reviewed. No IUP or adnexal mass seen on [place] and quant HCG low, most likely failed pregnancy but findings cannot exclude early pregnancy, or ectopic pregnancy,-discussed with pt. Will follow quant in 48 hrs.   Assessment and Plan   1. Pregnancy, location unknown   2. Vaginal bleeding in pregnancy   3. Blood type, Rh positive    Discharge home Follow up at [facility] on  10/25/17 at 11:00 am Ectopic/bleeding precautions  Allergies as of 10/23/2017   No Known Allergies     Medication List    You have not been prescribed any medications.    Donette Larry, CNM 10/23/2017, 12:39 PM

## 2017-10-23 NOTE — MAU Note (Signed)
Bleeding since yesterday at 1600, has been getting heavier. States she passed a golfball sized blood clots this AM. Some cramps but none now. No recent IC.

## 2017-10-24 LAB — GC/CHLAMYDIA PROBE AMP (~~LOC~~) NOT AT ARMC
Chlamydia: NEGATIVE
Neisseria Gonorrhea: NEGATIVE

## 2017-10-25 ENCOUNTER — Other Ambulatory Visit: Payer: Self-pay | Admitting: Obstetrics & Gynecology

## 2017-10-25 ENCOUNTER — Other Ambulatory Visit: Payer: BLUE CROSS/BLUE SHIELD

## 2017-10-25 DIAGNOSIS — O209 Hemorrhage in early pregnancy, unspecified: Secondary | ICD-10-CM

## 2017-10-25 NOTE — Progress Notes (Signed)
QBHCG ordered to follow labs

## 2017-10-26 LAB — BETA HCG QUANT (REF LAB): HCG QUANT: 5 m[IU]/mL

## 2017-10-31 ENCOUNTER — Other Ambulatory Visit: Payer: BLUE CROSS/BLUE SHIELD

## 2017-10-31 ENCOUNTER — Telehealth: Payer: Self-pay | Admitting: General Practice

## 2017-10-31 NOTE — Telephone Encounter (Signed)
Patient's husband called and left message on nurse line stating they are returning a missed call. Called patient and informed her of recent bhcg results & appt tomorrow for repeat bhcg. Patient verbalized understanding & had no questions.

## 2017-11-01 ENCOUNTER — Other Ambulatory Visit: Payer: BLUE CROSS/BLUE SHIELD

## 2017-11-01 DIAGNOSIS — E349 Endocrine disorder, unspecified: Secondary | ICD-10-CM

## 2017-11-02 LAB — BETA HCG QUANT (REF LAB)

## 2018-02-01 IMAGING — US US MFM FETAL BPP W/O NON-STRESS
1 series · 15 of 28 positions shown · non-contrast
Comparison: none

[Series 1: us mfm fetal bpp w/o non-stress · 36 acquisitions, 15 frames shown]
[im 1/36]
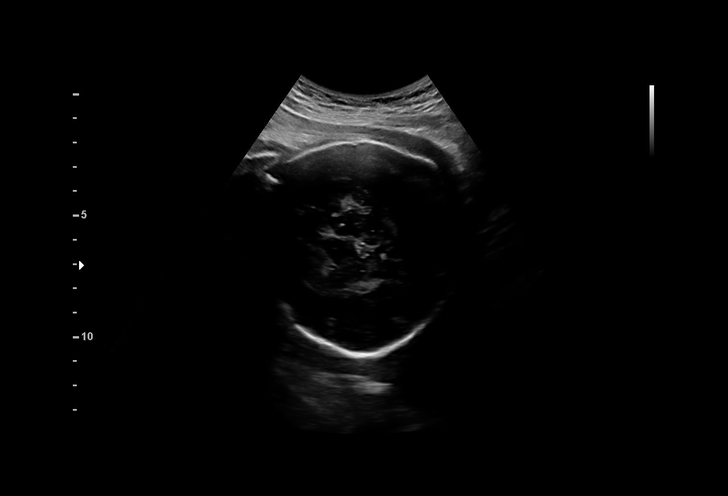
[im 3/36]
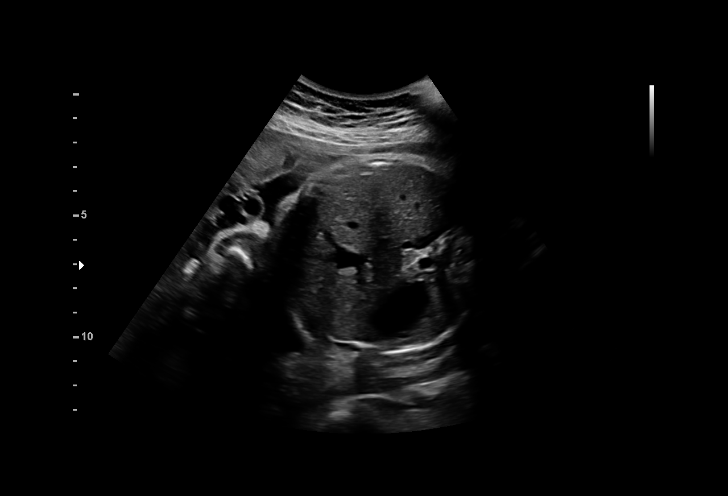
[im 6/36]
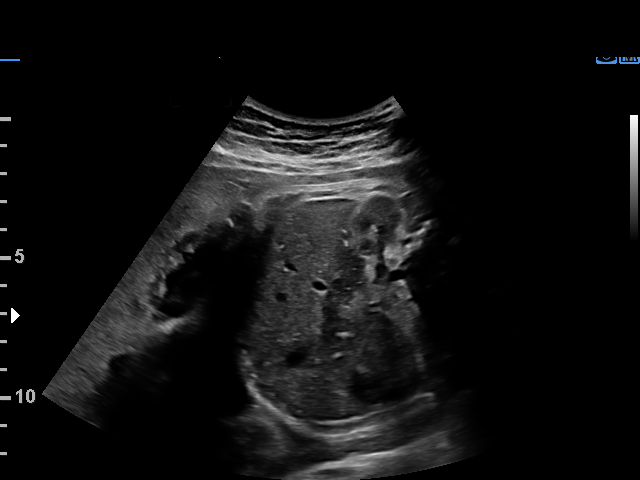
[im 8/36]
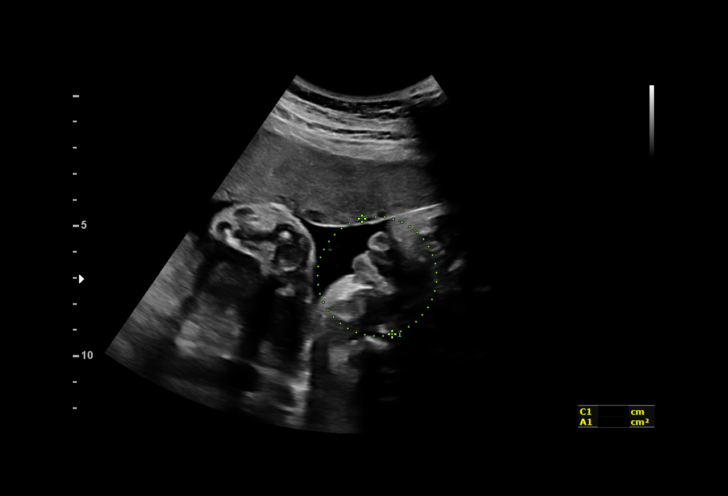
[im 11/36]
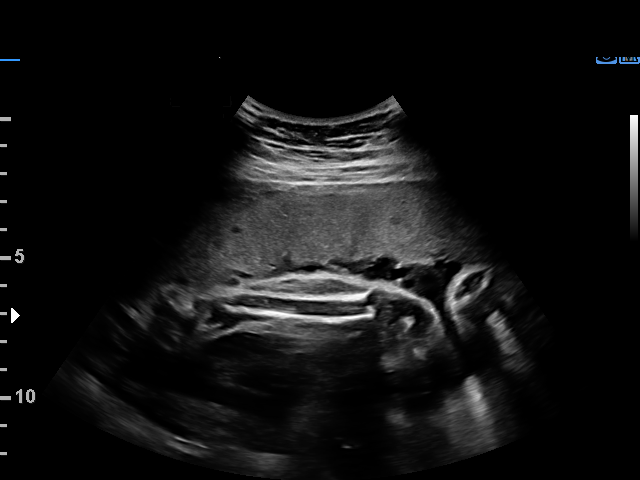
[im 13/36]
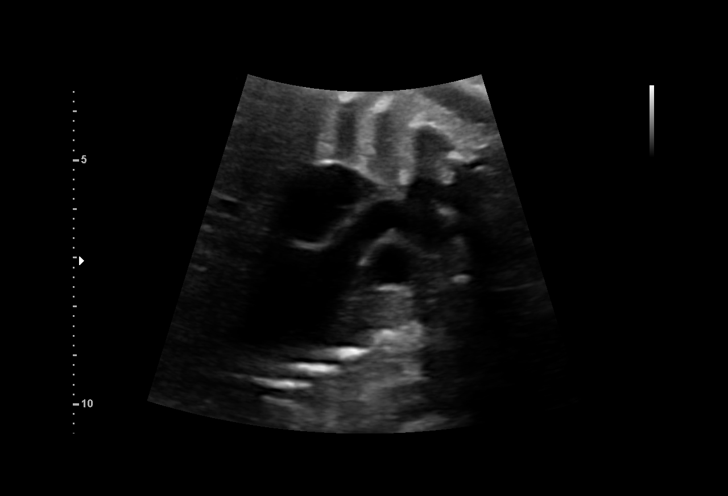
[im 16/36]
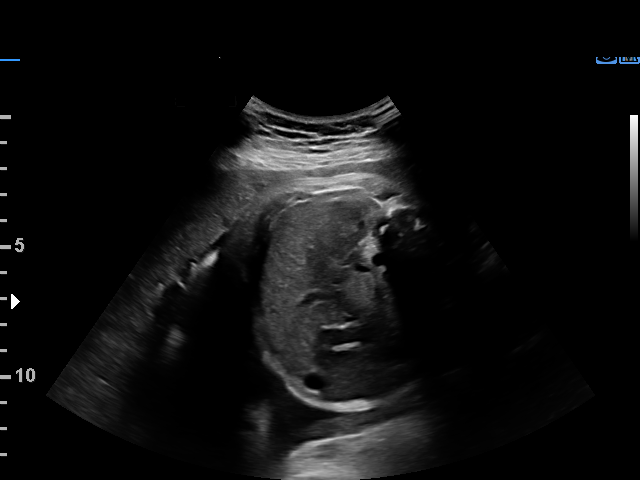
[im 19/36]
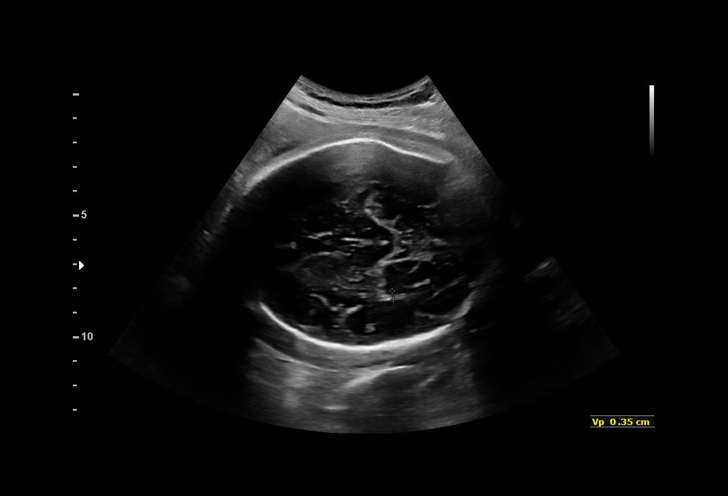
[im 20/36]
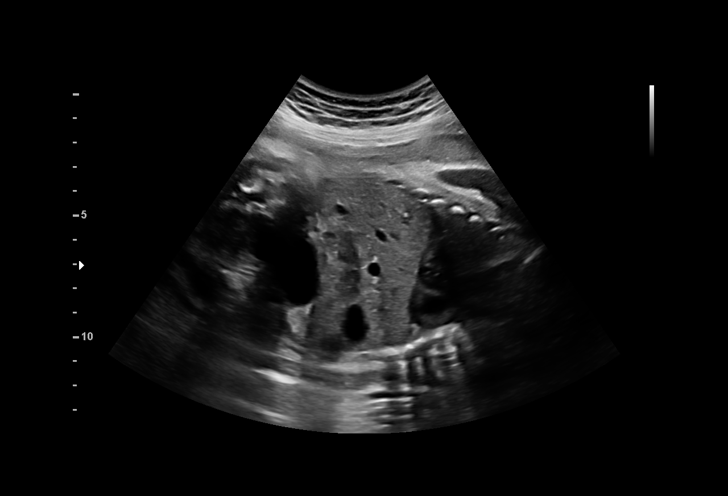
[im 23/36]
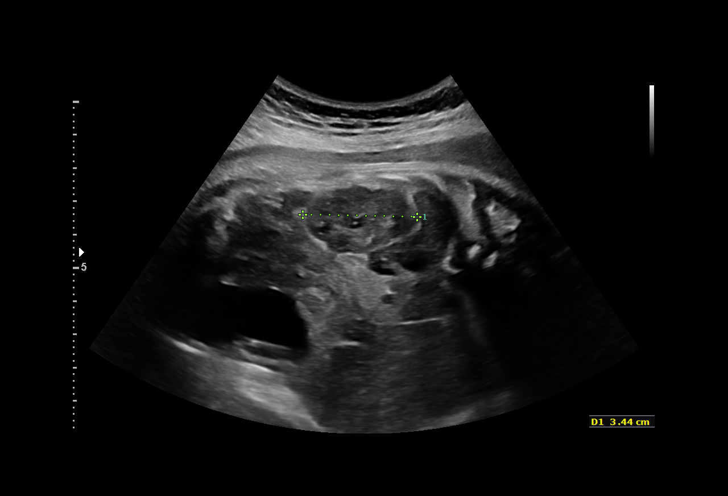
[im 25/36]
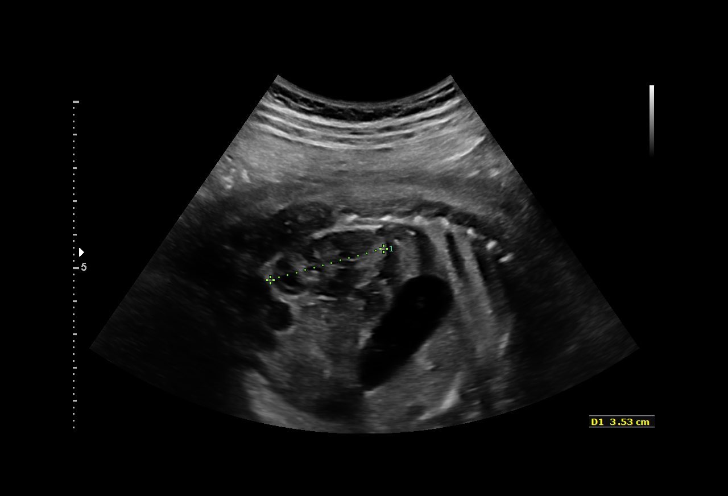
[im 28/36]
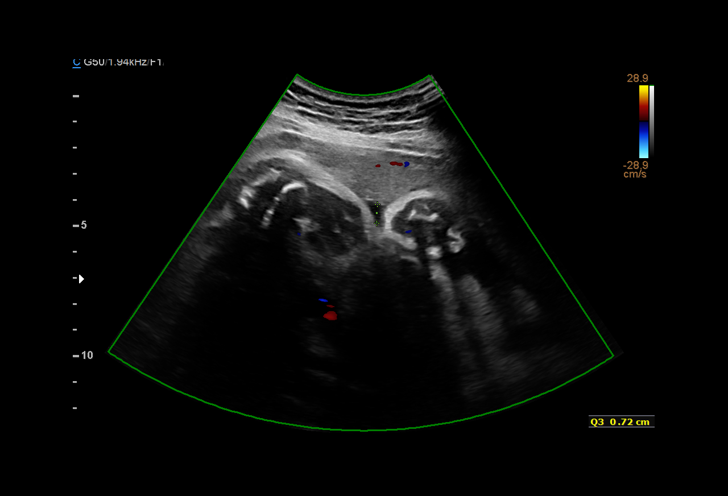
[im 30/36]
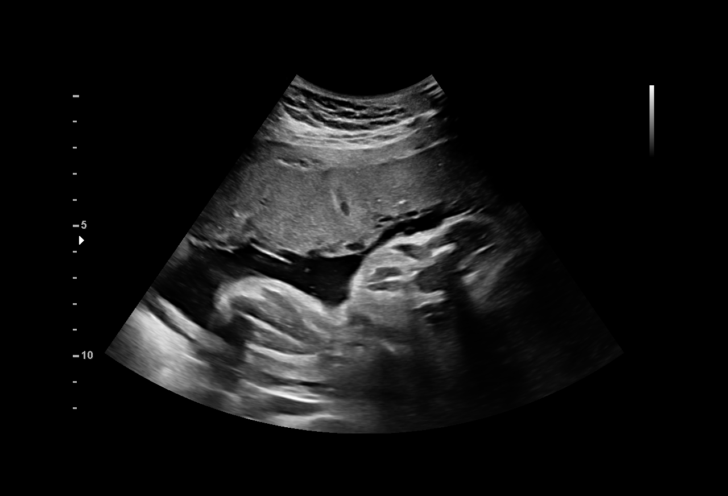
[im 33/36]
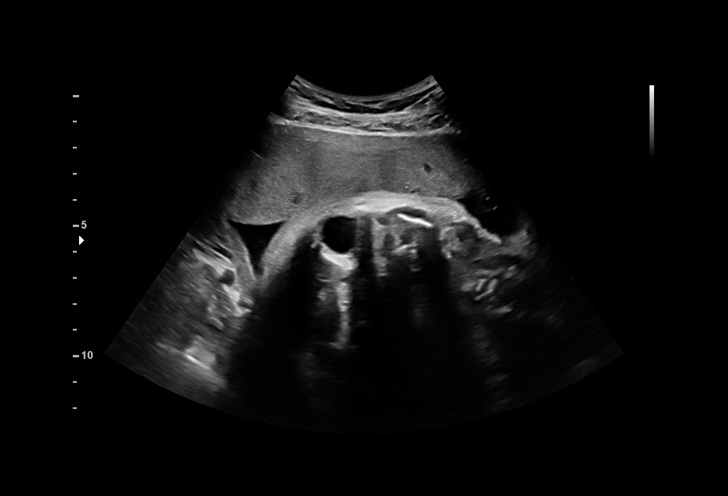
[im 36/36]
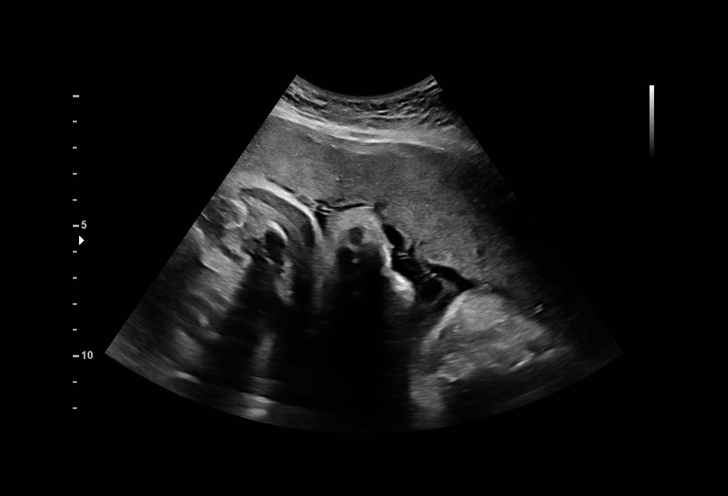

[15 of 28 positions shown; findings below may reference images not displayed]

([HOSPITAL])
[REDACTED] [HOSPITAL]

1  SADALHA ARCADE            [id number]      [id number]     [id number]
Indications

33 weeks gestation of pregnancy
Poor obstetric history: Previous IUFD
(stillbirth @ 
23 weeks)
OB History

Gravidity:    2         Term:   0        Prem:   1        SAB:   0
TOP:          0       Ectopic:  0        Living: 0
Fetal Evaluation

Num Of Fetuses:     1
Fetal Heart         151
Rate(bpm):
Cardiac Activity:   Observed
Presentation:       Cephalic
Placenta:           Anterior, above cervical os

Amniotic Fluid
AFI FV:      Subjectively within normal limits

AFI Sum(cm)     %Tile       Largest Pocket(cm)
11.32           28
RUQ(cm)       RLQ(cm)       LUQ(cm)        LLQ(cm)
4.04
Biophysical Evaluation

Amniotic F.V:   Within normal limits       F. Tone:        Observed
F. Movement:    Observed                   Score:          [DATE]
F. Breathing:   Observed
Gestational Age

LMP:           33w 4d       Date:   02/13/16                 EDD:   11/19/16
Best:          33w 4d    Det. By:   LMP  (02/13/16)          EDD:   11/19/16
Anatomy

Ventricles:            Appears normal         Kidneys:                Appear normal
Diaphragm:             Appears normal         Bladder:                Appears normal
Stomach:               Appears normal, left
sided
Cervix Uterus Adnexa

Cervix
Not visualized (advanced GA >58wks)

Uterus
No abnormality visualized.

Left Ovary
Not visualized.

Right Ovary
Not visualized.
Impression

Single IUP at 33w 4d
Hx of previous 23 week IUFD
BPP [DATE]
Normal amniotic fluid volume
Recommendations

Continue antenatal testing as scheduled
Serial ultasounds for growth every 4 weeks

## 2018-02-06 ENCOUNTER — Ambulatory Visit (INDEPENDENT_AMBULATORY_CARE_PROVIDER_SITE_OTHER): Payer: BLUE CROSS/BLUE SHIELD | Admitting: Family Medicine

## 2018-02-06 ENCOUNTER — Other Ambulatory Visit: Payer: Self-pay

## 2018-02-06 ENCOUNTER — Encounter: Payer: Self-pay | Admitting: Family Medicine

## 2018-02-06 ENCOUNTER — Other Ambulatory Visit (HOSPITAL_COMMUNITY)
Admission: RE | Admit: 2018-02-06 | Discharge: 2018-02-06 | Disposition: A | Discharge status: Home / Self Care | Payer: BLUE CROSS/BLUE SHIELD | Source: Ambulatory Visit | Attending: Family Medicine | Admitting: Family Medicine

## 2018-02-06 VITALS — BP 121/78 | HR 82 | Temp 98.0°F | Wt 155.2 lb

## 2018-02-06 DIAGNOSIS — Z3480 Encounter for supervision of other normal pregnancy, unspecified trimester: Secondary | ICD-10-CM

## 2018-02-06 DIAGNOSIS — Z3481 Encounter for supervision of other normal pregnancy, first trimester: Secondary | ICD-10-CM | POA: Diagnosis not present

## 2018-02-06 DIAGNOSIS — O0990 Supervision of high risk pregnancy, unspecified, unspecified trimester: Secondary | ICD-10-CM | POA: Insufficient documentation

## 2018-02-06 DIAGNOSIS — O219 Vomiting of pregnancy, unspecified: Secondary | ICD-10-CM

## 2018-02-06 DIAGNOSIS — Z8759 Personal history of other complications of pregnancy, childbirth and the puerperium: Secondary | ICD-10-CM

## 2018-02-06 MED ORDER — DOXYLAMINE-PYRIDOXINE 10-10 MG PO TBEC: 1.0000 | DELAYED_RELEASE_TABLET | Freq: Four times a day (QID) | ORAL | 1 refills | Status: DC

## 2018-02-06 MED ORDER — CONCEPT OB 130-92.4-1 MG PO CAPS
1.0000 | ORAL_CAPSULE | Freq: Every day | ORAL | 11 refills | Status: DC
Start: 2018-02-06 — End: 2019-12-30

## 2018-02-06 NOTE — Progress Notes (Signed)
Subjective:   Valerie Clark is a 27 y.o. [id number] at [redacted]w[redacted]d by LMP being seen today for her first obstetrical visit.  Her obstetrical history is significant for h/o IUFD at 20-23 wks, followed by normal term delivery. Neg APLA w/u.Marland Kitchen Patient does intend to breast feed. Pregnancy history fully reviewed.  Patient reports nausea and vomiting.  HISTORY: OB History  Gravida Para Term Preterm AB Living  5 2 1 1 2 1   SAB TAB Ectopic Multiple Live Births  0 0 0 0 1    # Outcome Date GA Lbr Len/2nd Weight Sex Delivery Anes PTL Lv  5 Current           4 Term 11/12/16 [redacted]w[redacted]d 00:48 / 00:27 6 lb 13.7 oz (3.11 kg) F Vag-Spont None  LIV     Birth Comments: wnl     Name: KAUR,GIRL Hiliary     Apgar1: 9  Apgar5: 9  3 Preterm 12/10/14 [redacted]w[redacted]d   F Vag-Spont   FD  2 AB           1 AB            Last pap smear was  05/2016 and was normal Past Medical History:  Diagnosis Date  . Medical history non-contributory    Past Surgical History:  Procedure Laterality Date  . NO PAST SURGERIES     Family History  Family history unknown: Yes   Social History   Tobacco Use  . Smoking status: Never Smoker  . Smokeless tobacco: Never Used  Substance Use Topics  . Alcohol use: No  . Drug use: No   No Known Allergies No current outpatient medications on file prior to visit.   No current facility-administered medications on file prior to visit.      Exam   Vitals:   02/06/18 1507  BP: 121/78  Pulse: 82  Temp: 98 F (36.7 C)  Weight: 155 lb 3.2 oz (70.4 kg)   Fetal Heart Rate (bpm): + on US  Uterus:     Pelvic Exam: Perineum: no hemorrhoids, normal perineum   Vulva: normal external genitalia, no lesions   Vagina:  normal mucosa, normal discharge   Cervix: no lesions and normal, pap smear done.    Adnexa: normal adnexa and no mass, fullness, tenderness   Bony Pelvis: average  System: General: well-developed, well-nourished female in no acute distress   Breast:  normal appearance, no  masses or tenderness   Skin: normal coloration and turgor, no rashes   Neurologic: oriented, normal, negative, normal mood   Extremities: normal strength, tone, and muscle mass, ROM of all joints is normal   HEENT PERRLA, extraocular movement intact and sclera clear, anicteric   Mouth/Teeth mucous membranes moist, pharynx normal without lesions and dental hygiene good   Neck supple and no masses   Cardiovascular: regular rate and rhythm   Respiratory:  no respiratory distress, normal breath sounds   Abdomen: soft, non-tender; bowel sounds normal; no masses,  no organomegaly     Assessment:   Pregnancy: [id number] Patient Active Problem List   Diagnosis Date Noted  . Supervision of normal pregnancy, antepartum 02/06/2018  . Low serum vitamin D 05/12/2016  . History of IUFD 05/08/2016     Plan:  1. Supervision of other normal pregnancy, antepartum  - Enroll Patient in [facility] - Obstetric Panel, Including HIV - Culture, OB Urine - Cytology - PAP - Genetic Screening - SMN1 Copy Number Analysis - Prenat w/o A  Vit-FeFum-FePo-FA (CONCEPT OB) 130-92.4-1 MG CAPS; Take 1 capsule by mouth daily.  Dispense: 30 capsule; Refill: 11  2. History of IUFD Negative w/u  3. Nausea and vomiting of pregnancy, antepartum Trial of Diclegis - Doxylamine-Pyridoxine (DICLEGIS) 10-10 MG TBEC; Take 1 tablet by mouth 4 (four) times daily. Take 2 q hs, add 1 q am or at noon prn  Dispense: 100 tablet; Refill: 1   Initial labs drawn. Continue prenatal vitamins. Genetic Screening discussed, NIPS: ordered. Ultrasound discussed; fetal anatomic survey: discussed at 18-19 wks. Problem list reviewed and updated. The nature of [facility] with multiple MDs and other Advanced Practice Providers was explained to patient; also emphasized that residents, students are part of our team. Routine obstetric precautions reviewed. Return in 4 weeks (on 03/06/2018).

## 2018-02-06 NOTE — Progress Notes (Signed)
NEW OB.  C/o nausea

## 2018-02-06 NOTE — Patient Instructions (Signed)

## 2018-02-07 LAB — CERVICOVAGINAL ANCILLARY ONLY
BACTERIAL VAGINITIS: NEGATIVE
Candida vaginitis: NEGATIVE
Chlamydia: NEGATIVE
Neisseria Gonorrhea: NEGATIVE
TRICH (WINDOWPATH): NEGATIVE

## 2018-02-07 NOTE — Addendum Note (Signed)
Addended by: Maretta Bees on: 02/07/2018 08:20 AM   Modules accepted: Orders

## 2018-02-08 LAB — CYTOLOGY - PAP
DIAGNOSIS: NEGATIVE
HPV: NOT DETECTED

## 2018-02-08 LAB — URINE CULTURE, OB REFLEX

## 2018-02-08 LAB — CULTURE, OB URINE

## 2018-02-12 ENCOUNTER — Encounter: Payer: Self-pay | Admitting: *Deleted

## 2018-02-15 LAB — OBSTETRIC PANEL, INCLUDING HIV
BASOS ABS: 0 10*3/uL (ref 0.0–0.2)
BASOS: 0 %
EOS (ABSOLUTE): 0.1 10*3/uL (ref 0.0–0.4)
Eos: 1 %
HEMATOCRIT: 38.8 % (ref 34.0–46.6)
HEP B S AG: NEGATIVE
HIV SCREEN 4TH GENERATION: NONREACTIVE
Hemoglobin: 13 g/dL (ref 11.1–15.9)
Immature Grans (Abs): 0.1 10*3/uL (ref 0.0–0.1)
Immature Granulocytes: 1 %
LYMPHS ABS: 2.3 10*3/uL (ref 0.7–3.1)
Lymphs: 24 %
MCH: 26.4 pg — AB (ref 26.6–33.0)
MCHC: 33.5 g/dL (ref 31.5–35.7)
MCV: 79 fL (ref 79–97)
Monocytes Absolute: 0.8 10*3/uL (ref 0.1–0.9)
Monocytes: 8 %
Neutrophils Absolute: 6.5 10*3/uL (ref 1.4–7.0)
Neutrophils: 66 %
PLATELETS: 396 10*3/uL (ref 150–450)
RBC: 4.93 x10E6/uL (ref 3.77–5.28)
RDW: 16.1 % — AB (ref 12.3–15.4)
RH TYPE: POSITIVE
RPR: NONREACTIVE
Rubella Antibodies, IGG: 13.1 index (ref >0.99)
WBC: 9.8 10*3/uL (ref 3.4–10.8)

## 2018-02-15 LAB — AB SCR+ANTIBODY ID

## 2018-02-15 LAB — SMN1 COPY NUMBER ANALYSIS (SMA CARRIER SCREENING)

## 2018-03-04 ENCOUNTER — Encounter: Payer: Self-pay | Admitting: Obstetrics and Gynecology

## 2018-03-04 ENCOUNTER — Ambulatory Visit (INDEPENDENT_AMBULATORY_CARE_PROVIDER_SITE_OTHER): Payer: BLUE CROSS/BLUE SHIELD | Admitting: Obstetrics and Gynecology

## 2018-03-04 VITALS — BP 119/80 | HR 88 | Wt 152.9 lb

## 2018-03-04 DIAGNOSIS — Z23 Encounter for immunization: Secondary | ICD-10-CM | POA: Diagnosis not present

## 2018-03-04 DIAGNOSIS — Z8759 Personal history of other complications of pregnancy, childbirth and the puerperium: Secondary | ICD-10-CM

## 2018-03-04 DIAGNOSIS — Z3490 Encounter for supervision of normal pregnancy, unspecified, unspecified trimester: Secondary | ICD-10-CM

## 2018-03-04 DIAGNOSIS — Z3492 Encounter for supervision of normal pregnancy, unspecified, second trimester: Secondary | ICD-10-CM

## 2018-03-04 NOTE — Progress Notes (Signed)
Pt is ROB G5P1 14w. Flu vaccine today.

## 2018-03-04 NOTE — Progress Notes (Signed)
   PRENATAL VISIT NOTE  Subjective:  Valerie Clark is a 27 y.o. [id number] at [redacted]w[redacted]d being seen today for ongoing prenatal care.  She is currently monitored for the following issues for this high-risk pregnancy and has History of IUFD; Low serum vitamin D; and Supervision of normal pregnancy, antepartum on their problem list.  Patient reports no complaints.  Contractions: Not present. Vag. Bleeding: None.   . Denies leaking of fluid.   The following portions of the patient's history were reviewed and updated as appropriate: allergies, current medications, past family history, past medical history, past social history, past surgical history and problem list. Problem list updated.  Objective:   Vitals:   03/04/18 0828  BP: 119/80  Pulse: 88  Weight: 152 lb 14.4 oz (69.4 kg)    Fetal Status:           General:  Alert, oriented and cooperative. Patient is in no acute distress.  Skin: Skin is warm and dry. No rash noted.   Cardiovascular: Normal heart rate noted  Respiratory: Normal respiratory effort, no problems with respiration noted  Abdomen: Soft, gravid, appropriate for gestational age.  Pain/Pressure: Absent     Pelvic: Cervical exam deferred        Extremities: Normal range of motion.  Edema: None  Mental Status: Normal mood and affect. Normal behavior. Normal judgment and thought content.   Assessment and Plan:  Pregnancy: [id number] at [redacted]w[redacted]d  1. Encounter for supervision of normal pregnancy, antepartum, unspecified gravidity Patient is doing well without complaints Anatomy ultrasound ordered Flu vaccine today - [place] MFM OB COMP + 14 WK; Future  2. History of IUFD Negative work up   Preterm labor symptoms and general obstetric precautions including but not limited to vaginal bleeding, contractions, leaking of fluid and fetal movement were reviewed in detail with the patient. Please refer to After Visit Summary for other counseling recommendations.  Return in about 4 weeks  (around 04/01/2018) for ROB.  No future appointments.  Catalina Antigua, MD

## 2018-04-01 ENCOUNTER — Encounter: Payer: Self-pay | Admitting: Obstetrics and Gynecology

## 2018-04-01 ENCOUNTER — Ambulatory Visit (INDEPENDENT_AMBULATORY_CARE_PROVIDER_SITE_OTHER): Payer: BLUE CROSS/BLUE SHIELD | Admitting: Obstetrics and Gynecology

## 2018-04-01 ENCOUNTER — Other Ambulatory Visit: Payer: Self-pay

## 2018-04-01 ENCOUNTER — Encounter (HOSPITAL_COMMUNITY): Payer: Self-pay

## 2018-04-01 VITALS — BP 122/74 | HR 83 | Wt 154.0 lb

## 2018-04-01 DIAGNOSIS — Z3480 Encounter for supervision of other normal pregnancy, unspecified trimester: Secondary | ICD-10-CM

## 2018-04-01 DIAGNOSIS — Z3482 Encounter for supervision of other normal pregnancy, second trimester: Secondary | ICD-10-CM

## 2018-04-01 MED ORDER — COMFORT FIT MATERNITY SUPP MED MISC: 0 refills | Status: DC

## 2018-04-01 NOTE — Progress Notes (Signed)
ROB.  C/o fatigue, headaches, back pain 6-8/10 x 2 months.

## 2018-04-01 NOTE — Progress Notes (Signed)
   PRENATAL VISIT NOTE  Subjective:  Valerie Clark is a 27 y.o. [id number] at [redacted]w[redacted]d being seen today for ongoing prenatal care.  She is currently monitored for the following issues for this high-risk pregnancy and has History of IUFD; Low serum vitamin D; and Supervision of normal pregnancy, antepartum on their problem list.  Patient reports backache and headache.  Contractions: Not present. Vag. Bleeding: None.  Movement: Present. Denies leaking of fluid.   The following portions of the patient's history were reviewed and updated as appropriate: allergies, current medications, past family history, past medical history, past social history, past surgical history and problem list. Problem list updated.  Objective:   Vitals:   04/01/18 0901  BP: 122/74  Pulse: 83  Weight: 154 lb (69.9 kg)    Fetal Status: Fetal Heart Rate (bpm): 147   Movement: Present     General:  Alert, oriented and cooperative. Patient is in no acute distress.  Skin: Skin is warm and dry. No rash noted.   Cardiovascular: Normal heart rate noted  Respiratory: Normal respiratory effort, no problems with respiration noted  Abdomen: Soft, gravid, appropriate for gestational age.  Pain/Pressure: Present     Pelvic: Cervical exam deferred        Extremities: Normal range of motion.  Edema: None  Mental Status: Normal mood and affect. Normal behavior. Normal judgment and thought content.   Assessment and Plan:  Pregnancy: [id number] at [redacted]w[redacted]d  1. Supervision of other normal pregnancy, antepartum Patient is doing well Encouraged patient to continue taking prenatal vitamins Encouraged patient to stay well hydrated and to take tylenol for headaches which she has not tried Encouraged patient to stretch and to remain active- maternity support belt provided AFP today Follow up anatomy ultrasound 11/4 - AFP, Serum, Open Spina Bifida  Preterm labor symptoms and general obstetric precautions including but not limited to vaginal  bleeding, contractions, leaking of fluid and fetal movement were reviewed in detail with the patient. Please refer to After Visit Summary for other counseling recommendations.  Return in about 4 weeks (around 04/29/2018) for ROB.  Future Appointments  Date Time Provider Department Center  04/08/2018  8:30 AM [facility] [place] 1 [facility]    Catalina Antigua, MD

## 2018-04-08 ENCOUNTER — Other Ambulatory Visit (HOSPITAL_COMMUNITY): Payer: Self-pay | Admitting: *Deleted

## 2018-04-08 ENCOUNTER — Other Ambulatory Visit: Payer: Self-pay | Admitting: Obstetrics and Gynecology

## 2018-04-08 ENCOUNTER — Ambulatory Visit (HOSPITAL_COMMUNITY)
Admission: RE | Admit: 2018-04-08 | Discharge: 2018-04-08 | Disposition: A | Discharge status: Home / Self Care | Payer: BLUE CROSS/BLUE SHIELD | Source: Ambulatory Visit | Attending: Obstetrics and Gynecology | Admitting: Obstetrics and Gynecology

## 2018-04-08 DIAGNOSIS — O09293 Supervision of pregnancy with other poor reproductive or obstetric history, third trimester: Secondary | ICD-10-CM | POA: Diagnosis not present

## 2018-04-08 DIAGNOSIS — Z3490 Encounter for supervision of normal pregnancy, unspecified, unspecified trimester: Secondary | ICD-10-CM

## 2018-04-08 DIAGNOSIS — Z8759 Personal history of other complications of pregnancy, childbirth and the puerperium: Secondary | ICD-10-CM

## 2018-04-08 DIAGNOSIS — Z3492 Encounter for supervision of normal pregnancy, unspecified, second trimester: Secondary | ICD-10-CM | POA: Insufficient documentation

## 2018-04-08 DIAGNOSIS — Z363 Encounter for antenatal screening for malformations: Secondary | ICD-10-CM | POA: Diagnosis not present

## 2018-04-08 DIAGNOSIS — Z3A19 19 weeks gestation of pregnancy: Secondary | ICD-10-CM | POA: Diagnosis not present

## 2018-04-08 LAB — AFP, SERUM, OPEN SPINA BIFIDA
AFP MoM: 0.46
AFP Value: 19.6 ng/mL
GEST. AGE ON COLLECTION DATE: 18 wk
Maternal Age At EDD: 27.8 yr
OSBR RISK 1 IN: 10000
Test Results:: NEGATIVE
WEIGHT: 154 [lb_av]

## 2018-04-29 ENCOUNTER — Encounter: Payer: Self-pay | Admitting: Obstetrics and Gynecology

## 2018-04-29 ENCOUNTER — Ambulatory Visit (INDEPENDENT_AMBULATORY_CARE_PROVIDER_SITE_OTHER): Payer: BLUE CROSS/BLUE SHIELD | Admitting: Obstetrics and Gynecology

## 2018-04-29 VITALS — BP 118/70 | HR 91 | Wt 156.0 lb

## 2018-04-29 DIAGNOSIS — Z3480 Encounter for supervision of other normal pregnancy, unspecified trimester: Secondary | ICD-10-CM

## 2018-04-29 DIAGNOSIS — Z3A22 22 weeks gestation of pregnancy: Secondary | ICD-10-CM

## 2018-04-29 DIAGNOSIS — Z8759 Personal history of other complications of pregnancy, childbirth and the puerperium: Secondary | ICD-10-CM

## 2018-04-29 NOTE — Progress Notes (Signed)
   PRENATAL VISIT NOTE  Subjective:  Valerie Clark is a 27 y.o. [id number] at 5431w0d being seen today for ongoing prenatal care.  She is currently monitored for the following issues for this high-risk pregnancy and has History of IUFD; Low serum vitamin D; and Supervision of normal pregnancy, antepartum on their problem list.  Patient reports no complaints.  Contractions: Not present. Vag. Bleeding: None.  Movement: Present. Denies leaking of fluid.   The following portions of the patient's history were reviewed and updated as appropriate: allergies, current medications, past family history, past medical history, past social history, past surgical history and problem list. Problem list updated.  Objective:   Vitals:   04/29/18 0841  BP: 118/70  Pulse: 91  Weight: 156 lb (70.8 kg)    Fetal Status: Fetal Heart Rate (bpm): 150 Fundal Height: 22 cm Movement: Present     General:  Alert, oriented and cooperative. Patient is in no acute distress.  Skin: Skin is warm and dry. No rash noted.   Cardiovascular: Normal heart rate noted  Respiratory: Normal respiratory effort, no problems with respiration noted  Abdomen: Soft, gravid, appropriate for gestational age.  Pain/Pressure: Absent     Pelvic: Cervical exam deferred        Extremities: Normal range of motion.     Mental Status: Normal mood and affect. Normal behavior. Normal judgment and thought content.   Assessment and Plan:  Pregnancy: [id number] at 6831w0d  1. Supervision of other normal pregnancy, antepartum Patient is doing well without complaints Patient reports resolution of headaches and back pain Anatomy ultrasound follow up scheduled on 12/2 Third trimester labs next visit  2. History of IUFD   Preterm labor symptoms and general obstetric precautions including but not limited to vaginal bleeding, contractions, leaking of fluid and fetal movement were reviewed in detail with the patient. Please refer to After Visit Summary for  other counseling recommendations.  Return in about 4 weeks (around 05/27/2018) for ROB, 2 hr glucola next visit.  Future Appointments  Date Time Provider Department Center  05/06/2018  9:15 AM [facility] [place] 4 [facility]    Catalina AntiguaPeggy Hadrian Yarbrough, MD

## 2018-05-06 ENCOUNTER — Ambulatory Visit (HOSPITAL_COMMUNITY)
Admission: RE | Admit: 2018-05-06 | Discharge: 2018-05-06 | Disposition: A | Discharge status: Home / Self Care | Payer: BLUE CROSS/BLUE SHIELD | Source: Ambulatory Visit | Attending: Obstetrics and Gynecology | Admitting: Obstetrics and Gynecology

## 2018-05-06 DIAGNOSIS — Z8759 Personal history of other complications of pregnancy, childbirth and the puerperium: Secondary | ICD-10-CM

## 2018-05-06 DIAGNOSIS — O09292 Supervision of pregnancy with other poor reproductive or obstetric history, second trimester: Secondary | ICD-10-CM | POA: Diagnosis not present

## 2018-05-06 DIAGNOSIS — Z362 Encounter for other antenatal screening follow-up: Secondary | ICD-10-CM | POA: Insufficient documentation

## 2018-05-06 DIAGNOSIS — Z3A23 23 weeks gestation of pregnancy: Secondary | ICD-10-CM | POA: Diagnosis not present

## 2018-05-27 ENCOUNTER — Ambulatory Visit (INDEPENDENT_AMBULATORY_CARE_PROVIDER_SITE_OTHER): Payer: BLUE CROSS/BLUE SHIELD | Admitting: Obstetrics and Gynecology

## 2018-05-27 ENCOUNTER — Encounter: Payer: Self-pay | Admitting: Obstetrics and Gynecology

## 2018-05-27 ENCOUNTER — Other Ambulatory Visit: Payer: BLUE CROSS/BLUE SHIELD

## 2018-05-27 VITALS — BP 111/73 | HR 94 | Wt 166.0 lb

## 2018-05-27 DIAGNOSIS — Z3482 Encounter for supervision of other normal pregnancy, second trimester: Secondary | ICD-10-CM

## 2018-05-27 DIAGNOSIS — Z3480 Encounter for supervision of other normal pregnancy, unspecified trimester: Secondary | ICD-10-CM

## 2018-05-27 DIAGNOSIS — Z8759 Personal history of other complications of pregnancy, childbirth and the puerperium: Secondary | ICD-10-CM

## 2018-05-27 NOTE — Patient Instructions (Signed)
Third Trimester of Pregnancy The third trimester is from week 28 through week 40 (months 7 through 9). The third trimester is a time when the unborn baby (fetus) is growing rapidly. At the end of the ninth month, the fetus is about 20 inches in length and weighs 6-10 pounds. Body changes during your third trimester Your body will continue to go through many changes during pregnancy. The changes vary from woman to woman. During the third trimester:  Your weight will continue to increase. You can expect to gain 25-35 pounds (11-16 kg) by the end of the pregnancy.  You may begin to get stretch marks on your hips, abdomen, and breasts.  You may urinate more often because the fetus is moving lower into your pelvis and pressing on your bladder.  You may develop or continue to have heartburn. This is caused by increased hormones that slow down muscles in the digestive tract.  You may develop or continue to have constipation because increased hormones slow digestion and cause the muscles that push waste through your intestines to relax.  You may develop hemorrhoids. These are swollen veins (varicose veins) in the rectum that can itch or be painful.  You may develop swollen, bulging veins (varicose veins) in your legs.  You may have increased body aches in the pelvis, back, or thighs. This is due to weight gain and increased hormones that are relaxing your joints.  You may have changes in your hair. These can include thickening of your hair, rapid growth, and changes in texture. Some women also have hair loss during or after pregnancy, or hair that feels dry or thin. Your hair will most likely return to normal after your baby is born.  Your breasts will continue to grow and they will continue to become tender. A yellow fluid (colostrum) may leak from your breasts. This is the first milk you are producing for your baby.  Your belly button may stick out.  You may notice more swelling in your hands,  face, or ankles.  You may have increased tingling or numbness in your hands, arms, and legs. The skin on your belly may also feel numb.  You may feel short of breath because of your expanding uterus.  You may have more problems sleeping. This can be caused by the size of your belly, increased need to urinate, and an increase in your body's metabolism.  You may notice the fetus "dropping," or moving lower in your abdomen (lightening).  You may have increased vaginal discharge.  You may notice your joints feel loose and you may have pain around your pelvic bone. What to expect at prenatal visits You will have prenatal exams every 2 weeks until week 36. Then you will have weekly prenatal exams. During a routine prenatal visit:  You will be weighed to make sure you and the baby are growing normally.  Your blood pressure will be taken.  Your abdomen will be measured to track your baby's growth.  The fetal heartbeat will be listened to.  Any test results from the previous visit will be discussed.  You may have a cervical check near your due date to see if your cervix has softened or thinned (effaced).  You will be tested for Group B streptococcus. This happens between 35 and 37 weeks. Your health care provider may ask you:  What your birth plan is.  How you are feeling.  If you are feeling the baby move.  If you have had any abnormal   symptoms, such as leaking fluid, bleeding, severe headaches, or abdominal cramping.  If you are using any tobacco products, including cigarettes, chewing tobacco, and electronic cigarettes.  If you have any questions. Other tests or screenings that may be performed during your third trimester include:  Blood tests that check for low iron levels (anemia).  Fetal testing to check the health, activity level, and growth of the fetus. Testing is done if you have certain medical conditions or if there are problems during the pregnancy.  Nonstress test  (NST). This test checks the health of your baby to make sure there are no signs of problems, such as the baby not getting enough oxygen. During this test, a belt is placed around your belly. The baby is made to move, and its heart rate is monitored during movement. What is false labor? False labor is a condition in which you feel small, irregular tightenings of the muscles in the womb (contractions) that usually go away with rest, changing position, or drinking water. These are called Braxton Hicks contractions. Contractions may last for hours, days, or even weeks before true labor sets in. If contractions come at regular intervals, become more frequent, increase in intensity, or become painful, you should see your health care provider. What are the signs of labor?  Abdominal cramps.  Regular contractions that start at 10 minutes apart and become stronger and more frequent with time.  Contractions that start on the top of the uterus and spread down to the lower abdomen and back.  Increased pelvic pressure and dull back pain.  A watery or bloody mucus discharge that comes from the vagina.  Leaking of amniotic fluid. This is also known as your "water breaking." It could be a slow trickle or a gush. Let your health care provider know if it has a color or strange odor. If you have any of these signs, call your health care provider right away, even if it is before your due date. Follow these instructions at home: Medicines  Follow your health care provider's instructions regarding medicine use. Specific medicines may be either safe or unsafe to take during pregnancy.  Take a prenatal vitamin that contains at least 600 micrograms (mcg) of folic acid.  If you develop constipation, try taking a stool softener if your health care provider approves. Eating and drinking   Eat a balanced diet that includes fresh fruits and vegetables, whole grains, good sources of protein such as meat, eggs, or tofu,  and low-fat dairy. Your health care provider will help you determine the amount of weight gain that is right for you.  Avoid raw meat and uncooked cheese. These carry germs that can cause birth defects in the baby.  If you have low calcium intake from food, talk to your health care provider about whether you should take a daily calcium supplement.  Eat four or five small meals rather than three large meals a day.  Limit foods that are high in fat and processed sugars, such as fried and sweet foods.  To prevent constipation: ? Drink enough fluid to keep your urine clear or pale yellow. ? Eat foods that are high in fiber, such as fresh fruits and vegetables, whole grains, and beans. Activity  Exercise only as directed by your health care provider. Most women can continue their usual exercise routine during pregnancy. Try to exercise for 30 minutes at least 5 days a week. Stop exercising if you experience uterine contractions.  Avoid heavy lifting.  Do   not exercise in extreme heat or humidity, or at high altitudes.  Wear low-heel, comfortable shoes.  Practice good posture.  You may continue to have sex unless your health care provider tells you otherwise. Relieving pain and discomfort  Take frequent breaks and rest with your legs elevated if you have leg cramps or low back pain.  Take warm sitz baths to soothe any pain or discomfort caused by hemorrhoids. Use hemorrhoid cream if your health care provider approves.  Wear a good support bra to prevent discomfort from breast tenderness.  If you develop varicose veins: ? Wear support pantyhose or compression stockings as told by your healthcare provider. ? Elevate your feet for 15 minutes, 3-4 times a day. Prenatal care  Write down your questions. Take them to your prenatal visits.  Keep all your prenatal visits as told by your health care provider. This is important. Safety  Wear your seat belt at all times when driving.  Make  a list of emergency phone numbers, including numbers for family, friends, the hospital, and police and fire departments. General instructions  Avoid cat litter boxes and soil used by cats. These carry germs that can cause birth defects in the baby. If you have a cat, ask someone to clean the litter box for you.  Do not travel far distances unless it is absolutely necessary and only with the approval of your health care provider.  Do not use hot tubs, steam rooms, or saunas.  Do not drink alcohol.  Do not use any products that contain nicotine or tobacco, such as cigarettes and e-cigarettes. If you need help quitting, ask your health care provider.  Do not use any medicinal herbs or unprescribed drugs. These chemicals affect the formation and growth of the baby.  Do not douche or use tampons or scented sanitary pads.  Do not cross your legs for long periods of time.  To prepare for the arrival of your baby: ? Take prenatal classes to understand, practice, and ask questions about labor and delivery. ? Make a trial run to the hospital. ? Visit the hospital and tour the maternity area. ? Arrange for maternity or paternity leave through employers. ? Arrange for family and friends to take care of pets while you are in the hospital. ? Purchase a rear-facing car seat and make sure you know how to install it in your car. ? Pack your hospital bag. ? Prepare the baby's nursery. Make sure to remove all pillows and stuffed animals from the baby's crib to prevent suffocation.  Visit your dentist if you have not gone during your pregnancy. Use a soft toothbrush to brush your teeth and be gentle when you floss. Contact a health care provider if:  You are unsure if you are in labor or if your water has broken.  You become dizzy.  You have mild pelvic cramps, pelvic pressure, or nagging pain in your abdominal area.  You have lower back pain.  You have persistent nausea, vomiting, or  diarrhea.  You have an unusual or bad smelling vaginal discharge.  You have pain when you urinate. Get help right away if:  Your water breaks before 37 weeks.  You have regular contractions less than 5 minutes apart before 37 weeks.  You have a fever.  You are leaking fluid from your vagina.  You have spotting or bleeding from your vagina.  You have severe abdominal pain or cramping.  You have rapid weight loss or weight gain.  You have   shortness of breath with chest pain.  You notice sudden or extreme swelling of your face, hands, ankles, feet, or legs.  Your baby makes fewer than 10 movements in 2 hours.  You have severe headaches that do not go away when you take medicine.  You have vision changes. Summary  The third trimester is from week 28 through week 40, months 7 through 9. The third trimester is a time when the unborn baby (fetus) is growing rapidly.  During the third trimester, your discomfort may increase as you and your baby continue to gain weight. You may have abdominal, leg, and back pain, sleeping problems, and an increased need to urinate.  During the third trimester your breasts will keep growing and they will continue to become tender. A yellow fluid (colostrum) may leak from your breasts. This is the first milk you are producing for your baby.  False labor is a condition in which you feel small, irregular tightenings of the muscles in the womb (contractions) that eventually go away. These are called Braxton Hicks contractions. Contractions may last for hours, days, or even weeks before true labor sets in.  Signs of labor can include: abdominal cramps; regular contractions that start at 10 minutes apart and become stronger and more frequent with time; watery or bloody mucus discharge that comes from the vagina; increased pelvic pressure and dull back pain; and leaking of amniotic fluid. This information is not intended to replace advice given to you by your  health care provider. Make sure you discuss any questions you have with your health care provider. Document Released: 05/16/2001 Document Revised: 06/27/2016 Document Reviewed: 06/27/2016 Elsevier Interactive Patient Education  2019 [employer]  

## 2018-05-27 NOTE — Progress Notes (Signed)
Subjective:  Valerie Clark is a 27 y.o. [id number] at 5457w0d being seen today for ongoing prenatal care.  She is currently monitored for the following issues for this low-risk pregnancy and has History of IUFD; Low serum vitamin D; and Supervision of normal pregnancy, antepartum on their problem list.  Patient reports no complaints.  Contractions: Not present. Vag. Bleeding: None.  Movement: Present. Denies leaking of fluid.   The following portions of the patient's history were reviewed and updated as appropriate: allergies, current medications, past family history, past medical history, past social history, past surgical history and problem list. Problem list updated.  Objective:   Vitals:   05/27/18 0829  BP: 111/73  Pulse: 94  Weight: 166 lb (75.3 kg)    Fetal Status: Fetal Heart Rate (bpm): 165   Movement: Present     General:  Alert, oriented and cooperative. Patient is in no acute distress.  Skin: Skin is warm and dry. No rash noted.   Cardiovascular: Normal heart rate noted  Respiratory: Normal respiratory effort, no problems with respiration noted  Abdomen: Soft, gravid, appropriate for gestational age. Pain/Pressure: Absent     Pelvic:  Cervical exam deferred        Extremities: Normal range of motion.  Edema: None  Mental Status: Normal mood and affect. Normal behavior. Normal judgment and thought content.   Urinalysis:      Assessment and Plan:  Pregnancy: [id number] at 4557w0d  1. Supervision of other normal pregnancy, antepartum Stable F/U U/S ordered to completed anatomy - Glucose Tolerance, 2 Hours w/1 Hour - CBC - HIV Antibody (routine testing w rflx) - RPR  2. History of IUFD W/U negative  Preterm labor symptoms and general obstetric precautions including but not limited to vaginal bleeding, contractions, leaking of fluid and fetal movement were reviewed in detail with the patient. Please refer to After Visit Summary for other counseling recommendations.  Return  in about 2 weeks (around 06/10/2018) for OB visit.   Hermina StaggersErvin, Priyah Schmuck L, MD

## 2018-05-28 LAB — CBC
HEMOGLOBIN: 12.7 g/dL (ref 11.1–15.9)
Hematocrit: 35.3 % (ref 34.0–46.6)
MCH: 30 pg (ref 26.6–33.0)
MCHC: 36 g/dL — AB (ref 31.5–35.7)
MCV: 83 fL (ref 79–97)
Platelets: 355 10*3/uL (ref 150–450)
RBC: 4.24 x10E6/uL (ref 3.77–5.28)
RDW: 14.6 % (ref 12.3–15.4)
WBC: 10.4 10*3/uL (ref 3.4–10.8)

## 2018-05-28 LAB — HIV ANTIBODY (ROUTINE TESTING W REFLEX): HIV SCREEN 4TH GENERATION: NONREACTIVE

## 2018-05-28 LAB — GLUCOSE TOLERANCE, 2 HOURS W/ 1HR
Glucose, 1 hour: 192 mg/dL — ABNORMAL HIGH (ref 65–179)
Glucose, 2 hour: 168 mg/dL — ABNORMAL HIGH (ref 65–152)
Glucose, Fasting: 101 mg/dL — ABNORMAL HIGH (ref 65–91)

## 2018-05-28 LAB — RPR: RPR Ser Ql: NONREACTIVE

## 2018-05-30 ENCOUNTER — Other Ambulatory Visit: Payer: Self-pay

## 2018-05-30 DIAGNOSIS — O24419 Gestational diabetes mellitus in pregnancy, unspecified control: Secondary | ICD-10-CM

## 2018-05-30 MED ORDER — ACCU-CHEK FASTCLIX LANCETS MISC: 1.0000 | Freq: Four times a day (QID) | 12 refills | Status: DC

## 2018-05-30 MED ORDER — GLUCOSE BLOOD VI STRP
ORAL_STRIP | 12 refills | Status: DC
Start: 2018-05-30 — End: 2018-08-28

## 2018-05-30 MED ORDER — ACCU-CHEK GUIDE ME W/DEVICE KIT
1.0000 | PACK | Freq: Four times a day (QID) | 0 refills | Status: DC
Start: 2018-05-30 — End: 2018-08-28

## 2018-06-05 NOTE — L&D Delivery Note (Addendum)
Patient: Euniqua Kalista MRN: [id number]  GBS status: negative, IAP given Amp and Gent for suspected chorioamnionitis  Patient is a 28 y.o. now [id number] s/p NSVD at [redacted]w[redacted]d, who was admitted for IOL for A2GDM. AROM 7h 81m prior to delivery with clear fluid.   Delivery Note At 4:40 PM a viable and healthy female was delivered via Vaginal, Spontaneous (Presentation: vertex; RIA).  APGAR: 8, 9; weight  pending.   Placenta status: spontaneous, intact.  3 vessel cord. Placenta to pathology due to concern for chorioamnionitis.  Anesthesia:  Intralesional lidocaine  Episiotomy: None Lacerations: 2nd degree;Perineal Suture Repair: 3.0 vicryl Est. Blood Loss (mL):  100  Head delivered ROA.One nuchal cord present. Shoulder and body delivered in summersault fashion to relieve nuchal cord. Infant with spontaneous cry, placed on mother's abdomen, dried and bulb suctioned. Cord clamped x 2 after 1-minute delay, and cut by family member. Cord blood drawn. Placenta delivered spontaneously with gentle cord traction. Fundus firm with massage and Pitocin. Perineum inspected and found to have 2nd degree laceration, which was repaired with 3-0 vicryl with good hemostasis achieved.  Mom to postpartum.  Baby to Couplet care / Skin to Skin.  Joana Reamer 08/26/2018, 5:23 PM  Attestation: I have seen this patient and agree with the resident's documentation. I was present for the entire delivery and assisted with the repair.   Cristal Deer. Earlene Plater, DO OB/GYN Fellow

## 2018-06-10 ENCOUNTER — Ambulatory Visit (INDEPENDENT_AMBULATORY_CARE_PROVIDER_SITE_OTHER): Payer: BLUE CROSS/BLUE SHIELD | Admitting: Advanced Practice Midwife

## 2018-06-10 ENCOUNTER — Other Ambulatory Visit (HOSPITAL_COMMUNITY): Payer: Self-pay | Admitting: *Deleted

## 2018-06-10 ENCOUNTER — Encounter: Payer: Self-pay | Admitting: Advanced Practice Midwife

## 2018-06-10 ENCOUNTER — Ambulatory Visit (HOSPITAL_COMMUNITY)
Admission: RE | Admit: 2018-06-10 | Discharge: 2018-06-10 | Disposition: A | Discharge status: Home / Self Care | Payer: BLUE CROSS/BLUE SHIELD | Source: Ambulatory Visit | Attending: Obstetrics and Gynecology | Admitting: Obstetrics and Gynecology

## 2018-06-10 VITALS — BP 111/70 | HR 96 | Wt 166.3 lb

## 2018-06-10 DIAGNOSIS — Z8759 Personal history of other complications of pregnancy, childbirth and the puerperium: Secondary | ICD-10-CM

## 2018-06-10 DIAGNOSIS — O24415 Gestational diabetes mellitus in pregnancy, controlled by oral hypoglycemic drugs: Secondary | ICD-10-CM | POA: Insufficient documentation

## 2018-06-10 DIAGNOSIS — O09292 Supervision of pregnancy with other poor reproductive or obstetric history, second trimester: Secondary | ICD-10-CM | POA: Diagnosis not present

## 2018-06-10 DIAGNOSIS — Z3480 Encounter for supervision of other normal pregnancy, unspecified trimester: Secondary | ICD-10-CM

## 2018-06-10 DIAGNOSIS — Z8632 Personal history of gestational diabetes: Secondary | ICD-10-CM | POA: Insufficient documentation

## 2018-06-10 DIAGNOSIS — Z3483 Encounter for supervision of other normal pregnancy, third trimester: Secondary | ICD-10-CM

## 2018-06-10 DIAGNOSIS — Z362 Encounter for other antenatal screening follow-up: Secondary | ICD-10-CM

## 2018-06-10 DIAGNOSIS — Z3A28 28 weeks gestation of pregnancy: Secondary | ICD-10-CM

## 2018-06-10 DIAGNOSIS — O09291 Supervision of pregnancy with other poor reproductive or obstetric history, first trimester: Secondary | ICD-10-CM | POA: Insufficient documentation

## 2018-06-10 DIAGNOSIS — O24410 Gestational diabetes mellitus in pregnancy, diet controlled: Secondary | ICD-10-CM

## 2018-06-10 NOTE — Patient Instructions (Signed)
Gestational Diabetes Mellitus, Self Care  Caring for yourself after you have been diagnosed with gestational diabetes (gestational diabetes mellitus) means keeping your blood sugar (glucose) under control. You can do that with a balance of:   Nutrition.   Exercise.   Lifestyle changes.   Medicines or insulin, if necessary.   Support from your team of health care providers and others.  The following information explains what you need to know to manage your gestational diabetes at home.  What are the risks?  If gestational diabetes is treated, it is unlikely to cause problems. If it is not controlled with treatment, it may cause problems during labor and delivery, and some of those problems can be harmful to the unborn baby (fetus) and the mother. Uncontrolled gestational diabetes may also cause the newborn baby to have breathing problems and low blood glucose.  Women who get gestational diabetes are more likely to develop it if they get pregnant again, and they are more likely to develop type 2 diabetes in the future.  How to monitor blood glucose     Check your blood glucose every day during your pregnancy. Do this as often as told by your health care provider.   Contact your health care provider if your blood glucose is above your target for two tests in a row.  Your health care provider will set individualized treatment goals for you. Generally, the goal of treatment is to maintain the following blood glucose levels during pregnancy:   Before meals (preprandial): at or below 95 mg/dL (5.3 mmol/L).   After meals (postprandial):  ? One hour after a meal: at or below 140 mg/dL (7.8 mmol/L).  ? Two hours after a meal: at or below 120 mg/dL (6.7 mmol/L).   A1c (hemoglobin A1c) level: 6-6.5%.  How to manage hyperglycemia and hypoglycemia  Hyperglycemia symptoms  Hyperglycemia, also called high blood glucose, occurs when blood glucose is too high. Make sure you know the early signs of hyperglycemia, such  as:   Increased thirst.   Hunger.   Feeling very tired.   Needing to urinate more often than usual.   Blurry vision.  Hypoglycemia symptoms  Hypoglycemia, also called low blood glucose, occurs with a blood glucose level at or below 70 mg/dL (3.9 mmol/L). The risk for hypoglycemia increases during or after exercise, during sleep, during illness, and when skipping meals or not eating for a long time (fasting). Symptoms may include:   Hunger.   Anxiety.   Sweating and feeling clammy.   Confusion.   Dizziness or feeling light-headed.   Sleepiness.   Nausea.   Increased heart rate.   Headache.   Blurry vision.   Irritability.   Tingling or numbness around the mouth, lips, or tongue.   A change in coordination.   Restless sleep.   Fainting.   Seizure.  It is important to know the symptoms of hypoglycemia and treat it right away. Always have a 15-gram rapid-acting carbohydrate snack with you to treat low blood glucose. Family members and close friends should also know the symptoms and should understand how to treat hypoglycemia, in case you are not able to treat yourself.  Treating hypoglycemia  If you are alert and able to swallow safely, follow the 15:15 rule:   Take 15 grams of a rapid-acting carbohydrate. Talk with your health care provider about how much you should take.   Rapid-acting options include:  ? Glucose pills (take 15 grams).  ? 6-8 pieces of hard candy.  ?   mmol/L), take 15 grams of a carbohydrate again.  If your blood glucose level does not increase above 70 mg/dL (3.9 mmol/L) after 3 tries, seek emergency medical care.  After your blood glucose level returns to normal, eat a meal or a snack within 1 hour. Treating  severe hypoglycemia Severe hypoglycemia is when your blood glucose level is at or below 54 mg/dL (3 mmol/L). Severe hypoglycemia is an emergency. Do not wait to see if the symptoms will go away. Get medical help right away. Call your local emergency services (911 in the [place] If you have severe hypoglycemia and you cannot eat or drink, you may need an injection of glucagon. A family member or close friend should learn how to check your blood glucose and how to give you a glucagon injection. Ask your health care provider if you need to have an emergency glucagon injection kit available. Severe hypoglycemia may need to be treated in a hospital. The treatment may include getting glucose through an IV. You may also need treatment for the cause of your hypoglycemia. Follow these instructions at home: Take diabetes medicines as told  If your health care provider prescribed insulin or diabetes medicines, take them every day.  Do not run out of insulin or other diabetes medicines that you take. Plan ahead so you always have these available.  If you use insulin, adjust your dosage based on how physically active you are and what foods you eat. Your health care provider will tell you how to adjust your dosage. Make healthy food choices  The things that you eat and drink affect your blood glucose (and your insulin dose, if this applies). Making good choices helps to control your diabetes and prevent other health problems. A healthy meal plan includes eating lean proteins, complex carbohydrates, fresh fruits and vegetables, low-fat dairy products, and healthy fats. Make an appointment to see a diet and nutrition specialist (registered dietitian) to help you create an eating plan that is right for you. Make sure that you:  Follow instructions from your health care provider about eating or drinking restrictions.  Drink enough fluid to keep your urine pale yellow.  Eat healthy snacks between nutritious  meals.  Keep a record of the carbohydrates that you eat. Do this by reading food labels and learning the standard serving sizes of foods.  Follow your sick day plan whenever you cannot eat or drink as usual. Make this plan in advance with your health care provider.  Stay active  Do 30 or more minutes of physical activity a day, or as much physical activity as your health care provider recommends during your pregnancy. ? Doing 10 minutes of exercise starting 30 minutes after each meal may help to control postprandial blood glucose levels.  If you start a new exercise or activity, work with your health care provider to adjust your insulin, medicines, or food intake as needed. Make healthy lifestyle choices  Do not drink alcohol.  Do not use any tobacco products, such as cigarettes, chewing tobacco, and e-cigarettes. If you need help quitting, ask your health care provider.  Learn to manage stress. If you need help with this, ask your health care provider. Care for your body  Keep your immunizations up to date.  Brush your teeth and gums two times a day, and floss one or more times a day. Visit your dentist one or more times every 6 months.  Maintain a healthy weight during your pregnancy. General instructions  Take over-the-counter and  prescription medicines only as told by your health care provider.  Talk with your health care provider about your risk for high blood pressure during pregnancy (preeclampsia or eclampsia).  Share your diabetes management plan with people in your workplace, school, and household.  Check your urine for ketones during your pregnancy when you are ill and as told by your health care provider.  Carry a medical alert card or wear medical alert jewelry that says you have gestational diabetes.  Keep all follow-up visits during your pregnancy (prenatal) and after delivery (postnatal) as told by your health care provider. This is important. Get the care that  you need after delivery  Have your blood glucose level checked 4-12 weeks after delivery. This is done with an oral glucose tolerance test (OGTT).  Get screened for diabetes at least every 3 years, or as often as told by your health care provider. Questions to ask your health care provider  Do I need to meet with a diabetes educator?  Where can I find a support group for people with gestational diabetes? Where to find more information For more information about gestational diabetes, visit:  American Diabetes Association (ADA): www.diabetes.org  Centers for Disease Control and Prevention (CDC): http://www.wolf.info/ Summary  Check your blood glucose every day during your pregnancy. Do this as often as told by your health care provider.  If your health care provider prescribed insulin or diabetes medicines, take them every day as told.  Keep all follow-up visits during your pregnancy (prenatal) and after delivery (postnatal) as told by your health care provider. This is important.  Have your blood glucose level checked 4-12 weeks after delivery. This information is not intended to replace advice given to you by your health care provider. Make sure you discuss any questions you have with your health care provider. Document Released: 09/13/2015 Document Revised: 11/12/2017 Document Reviewed: 06/25/2015 Elsevier Interactive Patient Education  2019 [employer].

## 2018-06-10 NOTE — Progress Notes (Signed)
   PRENATAL VISIT NOTE  Subjective:  Valerie Clark is a 28 y.o. [id number] at [redacted]w[redacted]d being seen today for ongoing prenatal care.  She is currently monitored for the following issues for this high-risk pregnancy and has History of IUFD; Low serum vitamin D; and Supervision of normal pregnancy, antepartum on their problem list.  Patient reports no complaints.  Contractions: Not present. Vag. Bleeding: None.  Movement: Present. Denies leaking of fluid.   The following portions of the patient's history were reviewed and updated as appropriate: allergies, current medications, past family history, past medical history, past social history, past surgical history and problem list. Problem list updated.  Objective:   Vitals:   06/10/18 1544  BP: 111/70  Pulse: 96  Weight: 75.4 kg    Fetal Status: Fetal Heart Rate (bpm): 148   Movement: Present     General:  Alert, oriented and cooperative. Patient is in no acute distress.  Skin: Skin is warm and dry. No rash noted.   Cardiovascular: Normal heart rate noted  Respiratory: Normal respiratory effort, no problems with respiration noted  Abdomen: Soft, gravid, appropriate for gestational age.  Pain/Pressure: Absent     Pelvic: Cervical exam deferred        Extremities: Normal range of motion.  Edema: None  Mental Status: Normal mood and affect. Normal behavior. Normal judgment and thought content.   Assessment and Plan:  Pregnancy: [id number] at [redacted]w[redacted]d  1. Supervision of other normal pregnancy, antepartum --Anticipatory guidance about next visits/weeks of pregnancy given.  2. History of IUFD --At 23 weeks  3. Gestational diabetes mellitus (GDM), antepartum, gestational diabetes method of control unspecified --Was out of town and has not gone to diabetes education. Borrowed a meter from family member and is taking glucose. Fastings 88-89.  PP 120-125.   --No changes for now, pt to schedule diabetes class ASAP.  Preterm labor symptoms and general  obstetric precautions including but not limited to vaginal bleeding, contractions, leaking of fluid and fetal movement were reviewed in detail with the patient. Please refer to After Visit Summary for other counseling recommendations.  No follow-ups on file.  Future Appointments  Date Time Provider Department Center  07/08/2018  7:45 AM [facility] [place] [facility]    Sharen Counter, CNM

## 2018-06-17 ENCOUNTER — Encounter: Payer: Self-pay | Admitting: Skilled Nursing Facility1

## 2018-06-17 ENCOUNTER — Encounter: Payer: BLUE CROSS/BLUE SHIELD | Attending: Obstetrics and Gynecology | Admitting: Skilled Nursing Facility1

## 2018-06-17 DIAGNOSIS — O24410 Gestational diabetes mellitus in pregnancy, diet controlled: Secondary | ICD-10-CM | POA: Insufficient documentation

## 2018-06-17 NOTE — Progress Notes (Signed)
Diabetes Self-Management Education  Visit Type: First/Initial  06/17/2018  [name] Valerie Clark, identified by name and date of birth, is a 28 y.o. female with a diagnosis of Diabetes: Gestational Diabetes.  Weight before pregnancy 153 pounds. Pt states she is 7 months along in her Pregnancy.  Fasting: 85-95 Post prandial 2 hours: 106-110 Pt states whenever she has a sweet cravings and does not eat it she gets weak.  Pts husband helped her with understanding english but she mostly understood. Pt states she sometimes has fresh made juice.  Pt states she has been feeling weak since changing her diet and decreasing her sugar content: Dietitian advised pt to communicate this with her doctor if the weakness persists.  [place] is [place] and eats traditional cuisine.  ASSESSMENT  Height 5\' 4"  (1.626 m), weight 162 lb 6.4 oz (73.7 kg), last menstrual period 11/26/2017, currently breastfeeding. Body mass index is 27.88 kg/m.  Diabetes Self-Management Education - 06/17/18 1101      Visit Information   Visit Type  First/Initial      Initial Visit   Diabetes Type  Gestational Diabetes    Are you currently following a meal plan?  No    Are you taking your medications as prescribed?  Not on Medications      Health Coping   How would you rate your overall health?  Good      Psychosocial Assessment   Patient Belief/Attitude about Diabetes  Motivated to manage diabetes    Other persons present  Spouse/SO      Pre-Education Assessment   Patient understands the diabetes disease and treatment process.  Needs Instruction    Patient understands incorporating nutritional management into lifestyle.  Needs Instruction    Patient undertands incorporating physical activity into lifestyle.  Needs Instruction    Patient understands using medications safely.  Needs Instruction    Patient understands monitoring blood glucose, interpreting and using results  Needs Instruction    Patient understands  prevention, detection, and treatment of acute complications.  Needs Instruction    Patient understands prevention, detection, and treatment of chronic complications.  Needs Instruction    Patient understands how to develop strategies to address psychosocial issues.  Needs Instruction    Patient understands how to develop strategies to promote health/change behavior.  Needs Instruction      Complications   How often do you check your blood sugar?  1-2 times/day    Fasting Blood glucose range (mg/dL)  [phone]    Postprandial Blood glucose range (mg/dL)  [phone]    Number of hypoglycemic episodes per month  0    Number of hyperglycemic episodes per week  0      Dietary Intake   Breakfast  black tea with milk and sugar whole wheat bread and potato green pepper broccoli     Snack (morning)  1 cookie or none    Lunch  1 cup white rice beans carrot and potato cauliflower and potato naan and chicken curry     Snack (afternoon)  tea with sugar and milk    Dinner  1 cup white rice beans carrot and potato cauliflower and potato naan and chicken curry     Snack (evening)  whole milk    Beverage(s)  whole milk, tea, water      Exercise   Exercise Type  Light (walking / raking leaves)    How many days per week to you exercise?  3    How many minutes  per day do you exercise?  20    Total minutes per week of exercise  60      Patient Education   Previous Diabetes Education  No    Disease state   Factors that contribute to the development of diabetes;Explored patient's options for treatment of their diabetes    Nutrition management   Food label reading, portion sizes and measuring food.;Role of diet in the treatment of diabetes and the relationship between the three main macronutrients and blood glucose level;Carbohydrate counting;Information on hints to eating out and maintain blood glucose control.;Reviewed blood glucose goals for pre and post meals and how to evaluate the patients' food intake on their  blood glucose level.    Physical activity and exercise   Identified with patient nutritional and/or medication changes necessary with exercise.;Role of exercise on diabetes management, blood pressure control and cardiac health.;Helped patient identify appropriate exercises in relation to his/her diabetes, diabetes complications and other health issue.    Monitoring  Purpose and frequency of SMBG.;Taught/evaluated SMBG meter.;Identified appropriate SMBG and/or A1C goals.    Acute complications  Taught treatment of hypoglycemia - the 15 rule.;Discussed and identified patients' treatment of hyperglycemia.    Chronic complications  Relationship between chronic complications and blood glucose control    Psychosocial adjustment  Role of stress on diabetes;Worked with patient to identify barriers to care and solutions;Helped patient identify a support system for diabetes management;Brainstormed with patient on coping mechanisms for social situations, getting support from significant others, dealing with feelings about diabetes    Preconception care  Reviewed with patient blood glucose goals with pregnancy      Individualized Goals (developed by patient)   Physical Activity  Exercise 5-7 days per week;15 minutes per day;30 minutes per day    Monitoring   test my blood glucose as discussed;test blood glucose pre and post meals as discussed      Post-Education Assessment   Patient understands the diabetes disease and treatment process.  Demonstrates understanding / competency    Patient understands incorporating nutritional management into lifestyle.  Demonstrates understanding / competency    Patient undertands incorporating physical activity into lifestyle.  Demonstrates understanding / competency    Patient understands using medications safely.  Demonstrates understanding / competency    Patient understands monitoring blood glucose, interpreting and using results  Demonstrates understanding / competency     Patient understands prevention, detection, and treatment of acute complications.  Demonstrates understanding / competency    Patient understands prevention, detection, and treatment of chronic complications.  Demonstrates understanding / competency    Patient understands how to develop strategies to address psychosocial issues.  Demonstrates understanding / competency    Patient understands how to develop strategies to promote health/change behavior.  Demonstrates understanding / competency      Outcomes   Expected Outcomes  Demonstrated interest in learning. Expect positive outcomes    Program Status  Completed       Individualized Plan for Diabetes Self-Management Training:   Learning Objective:  Patient will have a greater understanding of diabetes self-management. Patient education plan is to attend individual and/or group sessions per assessed needs and concerns.   Plan:   There are no Patient Instructions on file for this visit.  Expected Outcomes:  Demonstrated interest in learning. Expect positive outcomes  Education material provided: ADA Diabetes: Your Take Control Guide, Meal plan card, My Plate and Snack sheet  If problems or questions, patient to contact team via:  Phone  Future  DSME appointment:

## 2018-06-24 ENCOUNTER — Ambulatory Visit (INDEPENDENT_AMBULATORY_CARE_PROVIDER_SITE_OTHER): Payer: BLUE CROSS/BLUE SHIELD | Admitting: Advanced Practice Midwife

## 2018-06-24 ENCOUNTER — Encounter: Payer: Self-pay | Admitting: Advanced Practice Midwife

## 2018-06-24 VITALS — BP 112/70 | HR 93 | Wt 165.0 lb

## 2018-06-24 DIAGNOSIS — Z8759 Personal history of other complications of pregnancy, childbirth and the puerperium: Secondary | ICD-10-CM

## 2018-06-24 DIAGNOSIS — Z3480 Encounter for supervision of other normal pregnancy, unspecified trimester: Secondary | ICD-10-CM

## 2018-06-24 DIAGNOSIS — O24419 Gestational diabetes mellitus in pregnancy, unspecified control: Secondary | ICD-10-CM

## 2018-06-24 DIAGNOSIS — Z3483 Encounter for supervision of other normal pregnancy, third trimester: Secondary | ICD-10-CM

## 2018-06-24 NOTE — Patient Instructions (Signed)
Third Trimester of Pregnancy The third trimester is from week 28 through week 40 (months 7 through 9). The third trimester is a time when the unborn baby (fetus) is growing rapidly. At the end of the ninth month, the fetus is about 20 inches in length and weighs 6-10 pounds. Body changes during your third trimester Your body will continue to go through many changes during pregnancy. The changes vary from woman to woman. During the third trimester:  Your weight will continue to increase. You can expect to gain 25-35 pounds (11-16 kg) by the end of the pregnancy.  You may begin to get stretch marks on your hips, abdomen, and breasts.  You may urinate more often because the fetus is moving lower into your pelvis and pressing on your bladder.  You may develop or continue to have heartburn. This is caused by increased hormones that slow down muscles in the digestive tract.  You may develop or continue to have constipation because increased hormones slow digestion and cause the muscles that push waste through your intestines to relax.  You may develop hemorrhoids. These are swollen veins (varicose veins) in the rectum that can itch or be painful.  You may develop swollen, bulging veins (varicose veins) in your legs.  You may have increased body aches in the pelvis, back, or thighs. This is due to weight gain and increased hormones that are relaxing your joints.  You may have changes in your hair. These can include thickening of your hair, rapid growth, and changes in texture. Some women also have hair loss during or after pregnancy, or hair that feels dry or thin. Your hair will most likely return to normal after your baby is born.  Your breasts will continue to grow and they will continue to become tender. A yellow fluid (colostrum) may leak from your breasts. This is the first milk you are producing for your baby.  Your belly button may stick out.  You may notice more swelling in your hands,  face, or ankles.  You may have increased tingling or numbness in your hands, arms, and legs. The skin on your belly may also feel numb.  You may feel short of breath because of your expanding uterus.  You may have more problems sleeping. This can be caused by the size of your belly, increased need to urinate, and an increase in your body's metabolism.  You may notice the fetus "dropping," or moving lower in your abdomen (lightening).  You may have increased vaginal discharge.  You may notice your joints feel loose and you may have pain around your pelvic bone. What to expect at prenatal visits You will have prenatal exams every 2 weeks until week 36. Then you will have weekly prenatal exams. During a routine prenatal visit:  You will be weighed to make sure you and the baby are growing normally.  Your blood pressure will be taken.  Your abdomen will be measured to track your baby's growth.  The fetal heartbeat will be listened to.  Any test results from the previous visit will be discussed.  You may have a cervical check near your due date to see if your cervix has softened or thinned (effaced).  You will be tested for Group B streptococcus. This happens between 35 and 37 weeks. Your health care provider may ask you:  What your birth plan is.  How you are feeling.  If you are feeling the baby move.  If you have had any abnormal   symptoms, such as leaking fluid, bleeding, severe headaches, or abdominal cramping.  If you are using any tobacco products, including cigarettes, chewing tobacco, and electronic cigarettes.  If you have any questions. Other tests or screenings that may be performed during your third trimester include:  Blood tests that check for low iron levels (anemia).  Fetal testing to check the health, activity level, and growth of the fetus. Testing is done if you have certain medical conditions or if there are problems during the pregnancy.  Nonstress test  (NST). This test checks the health of your baby to make sure there are no signs of problems, such as the baby not getting enough oxygen. During this test, a belt is placed around your belly. The baby is made to move, and its heart rate is monitored during movement. What is false labor? False labor is a condition in which you feel small, irregular tightenings of the muscles in the womb (contractions) that usually go away with rest, changing position, or drinking water. These are called Braxton Hicks contractions. Contractions may last for hours, days, or even weeks before true labor sets in. If contractions come at regular intervals, become more frequent, increase in intensity, or become painful, you should see your health care provider. What are the signs of labor?  Abdominal cramps.  Regular contractions that start at 10 minutes apart and become stronger and more frequent with time.  Contractions that start on the top of the uterus and spread down to the lower abdomen and back.  Increased pelvic pressure and dull back pain.  A watery or bloody mucus discharge that comes from the vagina.  Leaking of amniotic fluid. This is also known as your "water breaking." It could be a slow trickle or a gush. Let your health care provider know if it has a color or strange odor. If you have any of these signs, call your health care provider right away, even if it is before your due date. Follow these instructions at home: Medicines  Follow your health care provider's instructions regarding medicine use. Specific medicines may be either safe or unsafe to take during pregnancy.  Take a prenatal vitamin that contains at least 600 micrograms (mcg) of folic acid.  If you develop constipation, try taking a stool softener if your health care provider approves. Eating and drinking   Eat a balanced diet that includes fresh fruits and vegetables, whole grains, good sources of protein such as meat, eggs, or tofu,  and low-fat dairy. Your health care provider will help you determine the amount of weight gain that is right for you.  Avoid raw meat and uncooked cheese. These carry germs that can cause birth defects in the baby.  If you have low calcium intake from food, talk to your health care provider about whether you should take a daily calcium supplement.  Eat four or five small meals rather than three large meals a day.  Limit foods that are high in fat and processed sugars, such as fried and sweet foods.  To prevent constipation: ? Drink enough fluid to keep your urine clear or pale yellow. ? Eat foods that are high in fiber, such as fresh fruits and vegetables, whole grains, and beans. Activity  Exercise only as directed by your health care provider. Most women can continue their usual exercise routine during pregnancy. Try to exercise for 30 minutes at least 5 days a week. Stop exercising if you experience uterine contractions.  Avoid heavy lifting.  Do   not exercise in extreme heat or humidity, or at high altitudes.  Wear low-heel, comfortable shoes.  Practice good posture.  You may continue to have sex unless your health care provider tells you otherwise. Relieving pain and discomfort  Take frequent breaks and rest with your legs elevated if you have leg cramps or low back pain.  Take warm sitz baths to soothe any pain or discomfort caused by hemorrhoids. Use hemorrhoid cream if your health care provider approves.  Wear a good support bra to prevent discomfort from breast tenderness.  If you develop varicose veins: ? Wear support pantyhose or compression stockings as told by your healthcare provider. ? Elevate your feet for 15 minutes, 3-4 times a day. Prenatal care  Write down your questions. Take them to your prenatal visits.  Keep all your prenatal visits as told by your health care provider. This is important. Safety  Wear your seat belt at all times when driving.  Make  a list of emergency phone numbers, including numbers for family, friends, the hospital, and police and fire departments. General instructions  Avoid cat litter boxes and soil used by cats. These carry germs that can cause birth defects in the baby. If you have a cat, ask someone to clean the litter box for you.  Do not travel far distances unless it is absolutely necessary and only with the approval of your health care provider.  Do not use hot tubs, steam rooms, or saunas.  Do not drink alcohol.  Do not use any products that contain nicotine or tobacco, such as cigarettes and e-cigarettes. If you need help quitting, ask your health care provider.  Do not use any medicinal herbs or unprescribed drugs. These chemicals affect the formation and growth of the baby.  Do not douche or use tampons or scented sanitary pads.  Do not cross your legs for long periods of time.  To prepare for the arrival of your baby: ? Take prenatal classes to understand, practice, and ask questions about labor and delivery. ? Make a trial run to the hospital. ? Visit the hospital and tour the maternity area. ? Arrange for maternity or paternity leave through employers. ? Arrange for family and friends to take care of pets while you are in the hospital. ? Purchase a rear-facing car seat and make sure you know how to install it in your car. ? Pack your hospital bag. ? Prepare the baby's nursery. Make sure to remove all pillows and stuffed animals from the baby's crib to prevent suffocation.  Visit your dentist if you have not gone during your pregnancy. Use a soft toothbrush to brush your teeth and be gentle when you floss. Contact a health care provider if:  You are unsure if you are in labor or if your water has broken.  You become dizzy.  You have mild pelvic cramps, pelvic pressure, or nagging pain in your abdominal area.  You have lower back pain.  You have persistent nausea, vomiting, or  diarrhea.  You have an unusual or bad smelling vaginal discharge.  You have pain when you urinate. Get help right away if:  Your water breaks before 37 weeks.  You have regular contractions less than 5 minutes apart before 37 weeks.  You have a fever.  You are leaking fluid from your vagina.  You have spotting or bleeding from your vagina.  You have severe abdominal pain or cramping.  You have rapid weight loss or weight gain.  You have   shortness of breath with chest pain.  You notice sudden or extreme swelling of your face, hands, ankles, feet, or legs.  Your baby makes fewer than 10 movements in 2 hours.  You have severe headaches that do not go away when you take medicine.  You have vision changes. Summary  The third trimester is from week 28 through week 40, months 7 through 9. The third trimester is a time when the unborn baby (fetus) is growing rapidly.  During the third trimester, your discomfort may increase as you and your baby continue to gain weight. You may have abdominal, leg, and back pain, sleeping problems, and an increased need to urinate.  During the third trimester your breasts will keep growing and they will continue to become tender. A yellow fluid (colostrum) may leak from your breasts. This is the first milk you are producing for your baby.  False labor is a condition in which you feel small, irregular tightenings of the muscles in the womb (contractions) that eventually go away. These are called Braxton Hicks contractions. Contractions may last for hours, days, or even weeks before true labor sets in.  Signs of labor can include: abdominal cramps; regular contractions that start at 10 minutes apart and become stronger and more frequent with time; watery or bloody mucus discharge that comes from the vagina; increased pelvic pressure and dull back pain; and leaking of amniotic fluid. This information is not intended to replace advice given to you by your  health care provider. Make sure you discuss any questions you have with your health care provider. Document Released: 05/16/2001 Document Revised: 06/27/2016 Document Reviewed: 06/27/2016 Elsevier Interactive Patient Education  2019 [employer]  

## 2018-06-24 NOTE — Progress Notes (Signed)
   PRENATAL VISIT NOTE  Subjective:  Valerie Clark is a 28 y.o. [id number] at [redacted]w[redacted]d being seen today for ongoing prenatal care.  She is currently monitored for the following issues for this high-risk pregnancy and has History of IUFD; Low serum vitamin D; Supervision of normal pregnancy, antepartum; and Gestational diabetes on their problem list.  Patient reports occasional contractions.  Contractions: Irritability. Vag. Bleeding: None.  Movement: Present. Denies leaking of fluid.   The following portions of the patient's history were reviewed and updated as appropriate: allergies, current medications, past family history, past medical history, past social history, past surgical history and problem list. Problem list updated.  Objective:   Vitals:   06/24/18 0926  BP: 112/70  Pulse: 93  Weight: 74.8 kg    Fetal Status: Fetal Heart Rate (bpm): 148   Movement: Present     General:  Alert, oriented and cooperative. Patient is in no acute distress.  Skin: Skin is warm and dry. No rash noted.   Cardiovascular: Normal heart rate noted  Respiratory: Normal respiratory effort, no problems with respiration noted  Abdomen: Soft, gravid, appropriate for gestational age.  Pain/Pressure: Present     Pelvic: Cervical exam deferred        Extremities: Normal range of motion.  Edema: None  Mental Status: Normal mood and affect. Normal behavior. Normal judgment and thought content.   Assessment and Plan:  Pregnancy: [id number] at [redacted]w[redacted]d  1. Supervision of other normal pregnancy, antepartum --Anticipatory guidance about next visits/weeks of pregnancy given. --Pt with one episode of 2-3 contractions yesterday that resolved.  Reviewed normal braxton-hicks contractions, reasons they may occur (dehydration, full bladder, pt or baby position, etc).  Reviewed signs of labor/reasons to seek care.  2. History of IUFD --At 23 weeks in 2016.   --Start antenatal testing at 32 weeks per MFM  3. Gestational  diabetes mellitus (GDM), antepartum, gestational diabetes method of control unspecified --Fasting: 1 out of 10 abnormal at 105, PP, 4-5 out of 20 above 120, mostly 120-130 with 2 in 140s. --Discussed dietary changes with pt, who is willing to make adjustments to her diet this week with less carbohydrates, more protein --Pt to call office if glucose continues to run out of range but otherwise we will follow up in 2 weeks to see if pt can remain diet controlled or will need medications.  Preterm labor symptoms and general obstetric precautions including but not limited to vaginal bleeding, contractions, leaking of fluid and fetal movement were reviewed in detail with the patient. Please refer to After Visit Summary for other counseling recommendations.  No follow-ups on file.  Future Appointments  Date Time Provider Department Center  07/08/2018  7:45 AM [facility] [place] [facility]  07/08/2018  9:15 AM Leftwich-[facility]ir[facility], Wilmer Floor, CNM [facility] None    Sharen Counter, CNM

## 2018-07-08 ENCOUNTER — Other Ambulatory Visit (HOSPITAL_COMMUNITY): Payer: Self-pay | Admitting: *Deleted

## 2018-07-08 ENCOUNTER — Ambulatory Visit (HOSPITAL_COMMUNITY)
Admission: RE | Admit: 2018-07-08 | Discharge: 2018-07-08 | Disposition: A | Discharge status: Home / Self Care | Payer: BLUE CROSS/BLUE SHIELD | Source: Ambulatory Visit | Attending: Advanced Practice Midwife | Admitting: Advanced Practice Midwife

## 2018-07-08 ENCOUNTER — Encounter (HOSPITAL_COMMUNITY): Payer: Self-pay

## 2018-07-08 ENCOUNTER — Other Ambulatory Visit: Payer: Self-pay

## 2018-07-08 ENCOUNTER — Ambulatory Visit (INDEPENDENT_AMBULATORY_CARE_PROVIDER_SITE_OTHER): Payer: BLUE CROSS/BLUE SHIELD | Admitting: Advanced Practice Midwife

## 2018-07-08 VITALS — BP 124/73 | HR 87 | Wt 166.0 lb

## 2018-07-08 DIAGNOSIS — O09293 Supervision of pregnancy with other poor reproductive or obstetric history, third trimester: Secondary | ICD-10-CM

## 2018-07-08 DIAGNOSIS — O24415 Gestational diabetes mellitus in pregnancy, controlled by oral hypoglycemic drugs: Secondary | ICD-10-CM

## 2018-07-08 DIAGNOSIS — Z362 Encounter for other antenatal screening follow-up: Secondary | ICD-10-CM | POA: Diagnosis not present

## 2018-07-08 DIAGNOSIS — Z3480 Encounter for supervision of other normal pregnancy, unspecified trimester: Secondary | ICD-10-CM

## 2018-07-08 DIAGNOSIS — Z8759 Personal history of other complications of pregnancy, childbirth and the puerperium: Secondary | ICD-10-CM

## 2018-07-08 DIAGNOSIS — O24419 Gestational diabetes mellitus in pregnancy, unspecified control: Secondary | ICD-10-CM

## 2018-07-08 DIAGNOSIS — O24410 Gestational diabetes mellitus in pregnancy, diet controlled: Secondary | ICD-10-CM

## 2018-07-08 DIAGNOSIS — Z3483 Encounter for supervision of other normal pregnancy, third trimester: Secondary | ICD-10-CM

## 2018-07-08 DIAGNOSIS — Z3A32 32 weeks gestation of pregnancy: Secondary | ICD-10-CM

## 2018-07-08 DIAGNOSIS — J069 Acute upper respiratory infection, unspecified: Secondary | ICD-10-CM

## 2018-07-08 DIAGNOSIS — R6889 Other general symptoms and signs: Secondary | ICD-10-CM

## 2018-07-08 LAB — PLEASE NOTE:

## 2018-07-08 LAB — INFLUENZA A AND B
Influenza A Ag, EIA: NEGATIVE
Influenza B Ag, EIA: NEGATIVE

## 2018-07-08 MED ORDER — METFORMIN HCL 500 MG PO TABS: 500.0000 mg | ORAL_TABLET | Freq: Two times a day (BID) | ORAL | 0 refills | Status: DC

## 2018-07-08 NOTE — Patient Instructions (Signed)
Third Trimester of Pregnancy The third trimester is from week 28 through week 40 (months 7 through 9). The third trimester is a time when the unborn baby (fetus) is growing rapidly. At the end of the ninth month, the fetus is about 20 inches in length and weighs 6-10 pounds. Body changes during your third trimester Your body will continue to go through many changes during pregnancy. The changes vary from woman to woman. During the third trimester:  Your weight will continue to increase. You can expect to gain 25-35 pounds (11-16 kg) by the end of the pregnancy.  You may begin to get stretch marks on your hips, abdomen, and breasts.  You may urinate more often because the fetus is moving lower into your pelvis and pressing on your bladder.  You may develop or continue to have heartburn. This is caused by increased hormones that slow down muscles in the digestive tract.  You may develop or continue to have constipation because increased hormones slow digestion and cause the muscles that push waste through your intestines to relax.  You may develop hemorrhoids. These are swollen veins (varicose veins) in the rectum that can itch or be painful.  You may develop swollen, bulging veins (varicose veins) in your legs.  You may have increased body aches in the pelvis, back, or thighs. This is due to weight gain and increased hormones that are relaxing your joints.  You may have changes in your hair. These can include thickening of your hair, rapid growth, and changes in texture. Some women also have hair loss during or after pregnancy, or hair that feels dry or thin. Your hair will most likely return to normal after your baby is born.  Your breasts will continue to grow and they will continue to become tender. A yellow fluid (colostrum) may leak from your breasts. This is the first milk you are producing for your baby.  Your belly button may stick out.  You may notice more swelling in your hands,  face, or ankles.  You may have increased tingling or numbness in your hands, arms, and legs. The skin on your belly may also feel numb.  You may feel short of breath because of your expanding uterus.  You may have more problems sleeping. This can be caused by the size of your belly, increased need to urinate, and an increase in your body's metabolism.  You may notice the fetus "dropping," or moving lower in your abdomen (lightening).  You may have increased vaginal discharge.  You may notice your joints feel loose and you may have pain around your pelvic bone. What to expect at prenatal visits You will have prenatal exams every 2 weeks until week 36. Then you will have weekly prenatal exams. During a routine prenatal visit:  You will be weighed to make sure you and the baby are growing normally.  Your blood pressure will be taken.  Your abdomen will be measured to track your baby's growth.  The fetal heartbeat will be listened to.  Any test results from the previous visit will be discussed.  You may have a cervical check near your due date to see if your cervix has softened or thinned (effaced).  You will be tested for Group B streptococcus. This happens between 35 and 37 weeks. Your health care provider may ask you:  What your birth plan is.  How you are feeling.  If you are feeling the baby move.  If you have had any abnormal   symptoms, such as leaking fluid, bleeding, severe headaches, or abdominal cramping.  If you are using any tobacco products, including cigarettes, chewing tobacco, and electronic cigarettes.  If you have any questions. Other tests or screenings that may be performed during your third trimester include:  Blood tests that check for low iron levels (anemia).  Fetal testing to check the health, activity level, and growth of the fetus. Testing is done if you have certain medical conditions or if there are problems during the pregnancy.  Nonstress test  (NST). This test checks the health of your baby to make sure there are no signs of problems, such as the baby not getting enough oxygen. During this test, a belt is placed around your belly. The baby is made to move, and its heart rate is monitored during movement. What is false labor? False labor is a condition in which you feel small, irregular tightenings of the muscles in the womb (contractions) that usually go away with rest, changing position, or drinking water. These are called Braxton Hicks contractions. Contractions may last for hours, days, or even weeks before true labor sets in. If contractions come at regular intervals, become more frequent, increase in intensity, or become painful, you should see your health care provider. What are the signs of labor?  Abdominal cramps.  Regular contractions that start at 10 minutes apart and become stronger and more frequent with time.  Contractions that start on the top of the uterus and spread down to the lower abdomen and back.  Increased pelvic pressure and dull back pain.  A watery or bloody mucus discharge that comes from the vagina.  Leaking of amniotic fluid. This is also known as your "water breaking." It could be a slow trickle or a gush. Let your health care provider know if it has a color or strange odor. If you have any of these signs, call your health care provider right away, even if it is before your due date. Follow these instructions at home: Medicines  Follow your health care provider's instructions regarding medicine use. Specific medicines may be either safe or unsafe to take during pregnancy.  Take a prenatal vitamin that contains at least 600 micrograms (mcg) of folic acid.  If you develop constipation, try taking a stool softener if your health care provider approves. Eating and drinking   Eat a balanced diet that includes fresh fruits and vegetables, whole grains, good sources of protein such as meat, eggs, or tofu,  and low-fat dairy. Your health care provider will help you determine the amount of weight gain that is right for you.  Avoid raw meat and uncooked cheese. These carry germs that can cause birth defects in the baby.  If you have low calcium intake from food, talk to your health care provider about whether you should take a daily calcium supplement.  Eat four or five small meals rather than three large meals a day.  Limit foods that are high in fat and processed sugars, such as fried and sweet foods.  To prevent constipation: ? Drink enough fluid to keep your urine clear or pale yellow. ? Eat foods that are high in fiber, such as fresh fruits and vegetables, whole grains, and beans. Activity  Exercise only as directed by your health care provider. Most women can continue their usual exercise routine during pregnancy. Try to exercise for 30 minutes at least 5 days a week. Stop exercising if you experience uterine contractions.  Avoid heavy lifting.  Do   not exercise in extreme heat or humidity, or at high altitudes.  Wear low-heel, comfortable shoes.  Practice good posture.  You may continue to have sex unless your health care provider tells you otherwise. Relieving pain and discomfort  Take frequent breaks and rest with your legs elevated if you have leg cramps or low back pain.  Take warm sitz baths to soothe any pain or discomfort caused by hemorrhoids. Use hemorrhoid cream if your health care provider approves.  Wear a good support bra to prevent discomfort from breast tenderness.  If you develop varicose veins: ? Wear support pantyhose or compression stockings as told by your healthcare provider. ? Elevate your feet for 15 minutes, 3-4 times a day. Prenatal care  Write down your questions. Take them to your prenatal visits.  Keep all your prenatal visits as told by your health care provider. This is important. Safety  Wear your seat belt at all times when driving.  Make  a list of emergency phone numbers, including numbers for family, friends, the hospital, and police and fire departments. General instructions  Avoid cat litter boxes and soil used by cats. These carry germs that can cause birth defects in the baby. If you have a cat, ask someone to clean the litter box for you.  Do not travel far distances unless it is absolutely necessary and only with the approval of your health care provider.  Do not use hot tubs, steam rooms, or saunas.  Do not drink alcohol.  Do not use any products that contain nicotine or tobacco, such as cigarettes and e-cigarettes. If you need help quitting, ask your health care provider.  Do not use any medicinal herbs or unprescribed drugs. These chemicals affect the formation and growth of the baby.  Do not douche or use tampons or scented sanitary pads.  Do not cross your legs for long periods of time.  To prepare for the arrival of your baby: ? Take prenatal classes to understand, practice, and ask questions about labor and delivery. ? Make a trial run to the hospital. ? Visit the hospital and tour the maternity area. ? Arrange for maternity or paternity leave through employers. ? Arrange for family and friends to take care of pets while you are in the hospital. ? Purchase a rear-facing car seat and make sure you know how to install it in your car. ? Pack your hospital bag. ? Prepare the baby's nursery. Make sure to remove all pillows and stuffed animals from the baby's crib to prevent suffocation.  Visit your dentist if you have not gone during your pregnancy. Use a soft toothbrush to brush your teeth and be gentle when you floss. Contact a health care provider if:  You are unsure if you are in labor or if your water has broken.  You become dizzy.  You have mild pelvic cramps, pelvic pressure, or nagging pain in your abdominal area.  You have lower back pain.  You have persistent nausea, vomiting, or  diarrhea.  You have an unusual or bad smelling vaginal discharge.  You have pain when you urinate. Get help right away if:  Your water breaks before 37 weeks.  You have regular contractions less than 5 minutes apart before 37 weeks.  You have a fever.  You are leaking fluid from your vagina.  You have spotting or bleeding from your vagina.  You have severe abdominal pain or cramping.  You have rapid weight loss or weight gain.  You have   shortness of breath with chest pain.  You notice sudden or extreme swelling of your face, hands, ankles, feet, or legs.  Your baby makes fewer than 10 movements in 2 hours.  You have severe headaches that do not go away when you take medicine.  You have vision changes. Summary  The third trimester is from week 28 through week 40, months 7 through 9. The third trimester is a time when the unborn baby (fetus) is growing rapidly.  During the third trimester, your discomfort may increase as you and your baby continue to gain weight. You may have abdominal, leg, and back pain, sleeping problems, and an increased need to urinate.  During the third trimester your breasts will keep growing and they will continue to become tender. A yellow fluid (colostrum) may leak from your breasts. This is the first milk you are producing for your baby.  False labor is a condition in which you feel small, irregular tightenings of the muscles in the womb (contractions) that eventually go away. These are called Braxton Hicks contractions. Contractions may last for hours, days, or even weeks before true labor sets in.  Signs of labor can include: abdominal cramps; regular contractions that start at 10 minutes apart and become stronger and more frequent with time; watery or bloody mucus discharge that comes from the vagina; increased pelvic pressure and dull back pain; and leaking of amniotic fluid. This information is not intended to replace advice given to you by your  health care provider. Make sure you discuss any questions you have with your health care provider. Document Released: 05/16/2001 Document Revised: 06/27/2016 Document Reviewed: 06/27/2016 Elsevier Interactive Patient Education  2019 [employer]  

## 2018-07-08 NOTE — Progress Notes (Signed)
   PRENATAL VISIT NOTE  Subjective:  Valerie Clark is a 28 y.o. [id number] at [redacted]w[redacted]d being seen today for ongoing prenatal care.  She is currently monitored for the following issues for this high-risk pregnancy and has History of IUFD; Low serum vitamin D; Supervision of normal pregnancy, antepartum; and Gestational diabetes on their problem list.  Patient reports cough, congestion, body aches.  Contractions: Not present. Vag. Bleeding: None.  Movement: Present. Denies leaking of fluid.   The following portions of the patient's history were reviewed and updated as appropriate: allergies, current medications, past family history, past medical history, past social history, past surgical history and problem list. Problem list updated.  Objective:   Vitals:   07/08/18 0914  BP: 124/73  Pulse: 87  Weight: 75.3 kg    Fetal Status: Fetal Heart Rate (bpm): 135   Movement: Present     General:  Alert, oriented and cooperative. Patient is in no acute distress.  Skin: Skin is warm and dry. No rash noted.   Cardiovascular: Normal heart rate noted  Respiratory: Normal respiratory effort, no problems with respiration noted  Abdomen: Soft, gravid, appropriate for gestational age.  Pain/Pressure: Present     Pelvic: Cervical exam deferred        Extremities: Normal range of motion.  Edema: None  Mental Status: Normal mood and affect. Normal behavior. Normal judgment and thought content.   Assessment and Plan:  Pregnancy: [id number] at [redacted]w[redacted]d   1. Supervision of other normal pregnancy, antepartum --Anticipatory guidance about next visits/weeks of pregnancy given.   2. History of IUFD --In antenatal testing for previous second trimester IUFD.  3. Flu-like symptoms --Congestion, rhinorrhea, cough, body aches, no fever. - Influenza A & B PCR  4. Upper respiratory infection, acute  5. Gestational diabetes mellitus (GDM) in third trimester, gestational diabetes method of control  unspecified --Fastings and post breakfast all wnl, post lunch and dinner are almost all elevated, 120s-140s with one outlier in 170s. --Start Metformin 500 mg BID --Reevaluate in 2 weeks  Preterm labor symptoms and general obstetric precautions including but not limited to vaginal bleeding, contractions, leaking of fluid and fetal movement were reviewed in detail with the patient. Please refer to After Visit Summary for other counseling recommendations.  No follow-ups on file.  Future Appointments  Date Time Provider Department Center  07/16/2018 11:15 AM [facility] [place] 4 [facility]  07/23/2018  7:45 AM [facility] [place] 2 [facility]  07/30/2018  9:15 AM [facility] [place] 4 [facility]  08/06/2018  8:00 AM [facility] [place] 3 [facility]    Sharen Counter, CNM

## 2018-07-08 NOTE — Addendum Note (Signed)
Addended by: Dalphine Handing on: 07/08/2018 10:15 AM   Modules accepted: Orders

## 2018-07-16 ENCOUNTER — Ambulatory Visit (HOSPITAL_COMMUNITY)
Admission: RE | Admit: 2018-07-16 | Discharge: 2018-07-16 | Disposition: A | Discharge status: Home / Self Care | Payer: BLUE CROSS/BLUE SHIELD | Source: Ambulatory Visit | Attending: Obstetrics and Gynecology | Admitting: Obstetrics and Gynecology

## 2018-07-16 ENCOUNTER — Encounter (HOSPITAL_COMMUNITY): Payer: Self-pay

## 2018-07-16 DIAGNOSIS — O24410 Gestational diabetes mellitus in pregnancy, diet controlled: Secondary | ICD-10-CM | POA: Diagnosis not present

## 2018-07-16 DIAGNOSIS — O09293 Supervision of pregnancy with other poor reproductive or obstetric history, third trimester: Secondary | ICD-10-CM | POA: Diagnosis not present

## 2018-07-16 DIAGNOSIS — Z3A33 33 weeks gestation of pregnancy: Secondary | ICD-10-CM | POA: Diagnosis not present

## 2018-07-23 ENCOUNTER — Encounter (HOSPITAL_COMMUNITY): Payer: Self-pay

## 2018-07-23 ENCOUNTER — Ambulatory Visit (INDEPENDENT_AMBULATORY_CARE_PROVIDER_SITE_OTHER): Payer: BLUE CROSS/BLUE SHIELD | Admitting: Advanced Practice Midwife

## 2018-07-23 ENCOUNTER — Ambulatory Visit (HOSPITAL_COMMUNITY)
Admission: RE | Admit: 2018-07-23 | Discharge: 2018-07-23 | Disposition: A | Discharge status: Home / Self Care | Payer: BLUE CROSS/BLUE SHIELD | Source: Ambulatory Visit | Attending: Obstetrics and Gynecology | Admitting: Obstetrics and Gynecology

## 2018-07-23 VITALS — BP 107/72 | HR 91 | Wt 166.0 lb

## 2018-07-23 DIAGNOSIS — Z8759 Personal history of other complications of pregnancy, childbirth and the puerperium: Secondary | ICD-10-CM

## 2018-07-23 DIAGNOSIS — Z3A34 34 weeks gestation of pregnancy: Secondary | ICD-10-CM

## 2018-07-23 DIAGNOSIS — O24415 Gestational diabetes mellitus in pregnancy, controlled by oral hypoglycemic drugs: Secondary | ICD-10-CM

## 2018-07-23 DIAGNOSIS — Z3483 Encounter for supervision of other normal pregnancy, third trimester: Secondary | ICD-10-CM

## 2018-07-23 DIAGNOSIS — O09293 Supervision of pregnancy with other poor reproductive or obstetric history, third trimester: Secondary | ICD-10-CM | POA: Diagnosis not present

## 2018-07-23 DIAGNOSIS — Z3480 Encounter for supervision of other normal pregnancy, unspecified trimester: Secondary | ICD-10-CM

## 2018-07-23 MED ORDER — METFORMIN HCL 1000 MG PO TABS: 1000.0000 mg | ORAL_TABLET | Freq: Two times a day (BID) | ORAL | 2 refills | Status: DC

## 2018-07-23 NOTE — Progress Notes (Signed)
   PRENATAL VISIT NOTE  Subjective:  Valerie Clark is a 28 y.o. [id number] at [redacted]w[redacted]d being seen today for ongoing prenatal care.  She is currently monitored for the following issues for this high-risk pregnancy and has History of IUFD; Low serum vitamin D; Supervision of normal pregnancy, antepartum; and Gestational diabetes mellitus (GDM) controlled on oral hypoglycemic drug on their problem list.  Patient reports no complaints.  Contractions: Not present. Vag. Bleeding: None.  Movement: Present. Denies leaking of fluid.   The following portions of the patient's history were reviewed and updated as appropriate: allergies, current medications, past family history, past medical history, past social history, past surgical history and problem list. Problem list updated.  Objective:   Vitals:   07/23/18 1008  BP: 107/72  Pulse: 91  Weight: 75.3 kg    Fetal Status: Fetal Heart Rate (bpm): 145 Fundal Height: 33 cm Movement: Present     General:  Alert, oriented and cooperative. Patient is in no acute distress.  Skin: Skin is warm and dry. No rash noted.   Cardiovascular: Normal heart rate noted  Respiratory: Normal respiratory effort, no problems with respiration noted  Abdomen: Soft, gravid, appropriate for gestational age.  Pain/Pressure: Absent     Pelvic: Cervical exam deferred        Extremities: Normal range of motion.     Mental Status: Normal mood and affect. Normal behavior. Normal judgment and thought content.   Assessment and Plan:  Pregnancy: [id number] at [redacted]w[redacted]d  1. Supervision of other normal pregnancy, antepartum --Anticipatory guidance about next visits/weeks of pregnancy given.  2. History of IUFD --Second trimester  3. Gestational diabetes mellitus (GDM) in third trimester controlled on oral hypoglycemic drug --Reviewed glucose log.  Fasting CBG range 87-97 with one outlier at 70 (pt was asymptomatic). PP CBG range 84 to 159 with most (>50% over 120).  --Discussed need for  increased management of GDM.  Insulin is safest, best drug to control glucose in pregnancy.  Pt is on low amount of Metformin desires to increase and reevaluate in 1 week. --Metformin increase from 500 BID to 1000 BID. --F/U with MD in 1 week  Preterm labor symptoms and general obstetric precautions including but not limited to vaginal bleeding, contractions, leaking of fluid and fetal movement were reviewed in detail with the patient. Please refer to After Visit Summary for other counseling recommendations.  Return in about 1 week (around 07/30/2018).  Future Appointments  Date Time Provider Department Center  07/30/2018  9:15 AM [facility] [place] 4 [facility]  08/06/2018  8:00 AM [facility] [place] 3 [facility]-US    Sharen Counter, CNM

## 2018-07-23 NOTE — Addendum Note (Signed)
Addended by: Sharen Counter A on: 07/23/2018 12:22 PM   Modules accepted: Orders

## 2018-07-23 NOTE — Patient Instructions (Signed)
Gestational Diabetes Mellitus, Self Care  Caring for yourself after you have been diagnosed with gestational diabetes (gestational diabetes mellitus) means keeping your blood sugar (glucose) under control. You can do that with a balance of:   Nutrition.   Exercise.   Lifestyle changes.   Medicines or insulin, if necessary.   Support from your team of health care providers and others.  The following information explains what you need to know to manage your gestational diabetes at home.  What are the risks?  If gestational diabetes is treated, it is unlikely to cause problems. If it is not controlled with treatment, it may cause problems during labor and delivery, and some of those problems can be harmful to the unborn baby (fetus) and the mother. Uncontrolled gestational diabetes may also cause the newborn baby to have breathing problems and low blood glucose.  Women who get gestational diabetes are more likely to develop it if they get pregnant again, and they are more likely to develop type 2 diabetes in the future.  How to monitor blood glucose     Check your blood glucose every day during your pregnancy. Do this as often as told by your health care provider.   Contact your health care provider if your blood glucose is above your target for two tests in a row.  Your health care provider will set individualized treatment goals for you. Generally, the goal of treatment is to maintain the following blood glucose levels during pregnancy:   Before meals (preprandial): at or below 95 mg/dL (5.3 mmol/L).   After meals (postprandial):  ? One hour after a meal: at or below 140 mg/dL (7.8 mmol/L).  ? Two hours after a meal: at or below 120 mg/dL (6.7 mmol/L).   A1c (hemoglobin A1c) level: 6-6.5%.  How to manage hyperglycemia and hypoglycemia  Hyperglycemia symptoms  Hyperglycemia, also called high blood glucose, occurs when blood glucose is too high. Make sure you know the early signs of hyperglycemia, such  as:   Increased thirst.   Hunger.   Feeling very tired.   Needing to urinate more often than usual.   Blurry vision.  Hypoglycemia symptoms  Hypoglycemia, also called low blood glucose, occurs with a blood glucose level at or below 70 mg/dL (3.9 mmol/L). The risk for hypoglycemia increases during or after exercise, during sleep, during illness, and when skipping meals or not eating for a long time (fasting). Symptoms may include:   Hunger.   Anxiety.   Sweating and feeling clammy.   Confusion.   Dizziness or feeling light-headed.   Sleepiness.   Nausea.   Increased heart rate.   Headache.   Blurry vision.   Irritability.   Tingling or numbness around the mouth, lips, or tongue.   A change in coordination.   Restless sleep.   Fainting.   Seizure.  It is important to know the symptoms of hypoglycemia and treat it right away. Always have a 15-gram rapid-acting carbohydrate snack with you to treat low blood glucose. Family members and close friends should also know the symptoms and should understand how to treat hypoglycemia, in case you are not able to treat yourself.  Treating hypoglycemia  If you are alert and able to swallow safely, follow the 15:15 rule:   Take 15 grams of a rapid-acting carbohydrate. Talk with your health care provider about how much you should take.   Rapid-acting options include:  ? Glucose pills (take 15 grams).  ? 6-8 pieces of hard candy.  ?   mmol/L), take 15 grams of a carbohydrate again.  If your blood glucose level does not increase above 70 mg/dL (3.9 mmol/L) after 3 tries, seek emergency medical care.  After your blood glucose level returns to normal, eat a meal or a snack within 1 hour. Treating  severe hypoglycemia Severe hypoglycemia is when your blood glucose level is at or below 54 mg/dL (3 mmol/L). Severe hypoglycemia is an emergency. Do not wait to see if the symptoms will go away. Get medical help right away. Call your local emergency services (911 in the [place] If you have severe hypoglycemia and you cannot eat or drink, you may need an injection of glucagon. A family member or close friend should learn how to check your blood glucose and how to give you a glucagon injection. Ask your health care provider if you need to have an emergency glucagon injection kit available. Severe hypoglycemia may need to be treated in a hospital. The treatment may include getting glucose through an IV. You may also need treatment for the cause of your hypoglycemia. Follow these instructions at home: Take diabetes medicines as told  If your health care provider prescribed insulin or diabetes medicines, take them every day.  Do not run out of insulin or other diabetes medicines that you take. Plan ahead so you always have these available.  If you use insulin, adjust your dosage based on how physically active you are and what foods you eat. Your health care provider will tell you how to adjust your dosage. Make healthy food choices  The things that you eat and drink affect your blood glucose (and your insulin dose, if this applies). Making good choices helps to control your diabetes and prevent other health problems. A healthy meal plan includes eating lean proteins, complex carbohydrates, fresh fruits and vegetables, low-fat dairy products, and healthy fats. Make an appointment to see a diet and nutrition specialist (registered dietitian) to help you create an eating plan that is right for you. Make sure that you:  Follow instructions from your health care provider about eating or drinking restrictions.  Drink enough fluid to keep your urine pale yellow.  Eat healthy snacks between nutritious  meals.  Keep a record of the carbohydrates that you eat. Do this by reading food labels and learning the standard serving sizes of foods.  Follow your sick day plan whenever you cannot eat or drink as usual. Make this plan in advance with your health care provider.  Stay active  Do 30 or more minutes of physical activity a day, or as much physical activity as your health care provider recommends during your pregnancy. ? Doing 10 minutes of exercise starting 30 minutes after each meal may help to control postprandial blood glucose levels.  If you start a new exercise or activity, work with your health care provider to adjust your insulin, medicines, or food intake as needed. Make healthy lifestyle choices  Do not drink alcohol.  Do not use any tobacco products, such as cigarettes, chewing tobacco, and e-cigarettes. If you need help quitting, ask your health care provider.  Learn to manage stress. If you need help with this, ask your health care provider. Care for your body  Keep your immunizations up to date.  Brush your teeth and gums two times a day, and floss one or more times a day. Visit your dentist one or more times every 6 months.  Maintain a healthy weight during your pregnancy. General instructions  Take over-the-counter and  prescription medicines only as told by your health care provider.  Talk with your health care provider about your risk for high blood pressure during pregnancy (preeclampsia or eclampsia).  Share your diabetes management plan with people in your workplace, school, and household.  Check your urine for ketones during your pregnancy when you are ill and as told by your health care provider.  Carry a medical alert card or wear medical alert jewelry that says you have gestational diabetes.  Keep all follow-up visits during your pregnancy (prenatal) and after delivery (postnatal) as told by your health care provider. This is important. Get the care that  you need after delivery  Have your blood glucose level checked 4-12 weeks after delivery. This is done with an oral glucose tolerance test (OGTT).  Get screened for diabetes at least every 3 years, or as often as told by your health care provider. Questions to ask your health care provider  Do I need to meet with a diabetes educator?  Where can I find a support group for people with gestational diabetes? Where to find more information For more information about gestational diabetes, visit:  American Diabetes Association (ADA): www.diabetes.org  Centers for Disease Control and Prevention (CDC): http://www.wolf.info/ Summary  Check your blood glucose every day during your pregnancy. Do this as often as told by your health care provider.  If your health care provider prescribed insulin or diabetes medicines, take them every day as told.  Keep all follow-up visits during your pregnancy (prenatal) and after delivery (postnatal) as told by your health care provider. This is important.  Have your blood glucose level checked 4-12 weeks after delivery. This information is not intended to replace advice given to you by your health care provider. Make sure you discuss any questions you have with your health care provider. Document Released: 09/13/2015 Document Revised: 11/12/2017 Document Reviewed: 06/25/2015 Elsevier Interactive Patient Education  2019 [employer].

## 2018-07-30 ENCOUNTER — Ambulatory Visit (INDEPENDENT_AMBULATORY_CARE_PROVIDER_SITE_OTHER): Payer: BLUE CROSS/BLUE SHIELD | Admitting: Obstetrics and Gynecology

## 2018-07-30 ENCOUNTER — Encounter: Payer: Self-pay | Admitting: Obstetrics and Gynecology

## 2018-07-30 ENCOUNTER — Ambulatory Visit (HOSPITAL_COMMUNITY)
Admission: RE | Admit: 2018-07-30 | Discharge: 2018-07-30 | Disposition: A | Discharge status: Home / Self Care | Payer: BLUE CROSS/BLUE SHIELD | Source: Ambulatory Visit | Attending: Obstetrics and Gynecology | Admitting: Obstetrics and Gynecology

## 2018-07-30 VITALS — BP 120/74 | HR 105 | Wt 170.5 lb

## 2018-07-30 DIAGNOSIS — Z3480 Encounter for supervision of other normal pregnancy, unspecified trimester: Secondary | ICD-10-CM

## 2018-07-30 DIAGNOSIS — Z3A35 35 weeks gestation of pregnancy: Secondary | ICD-10-CM

## 2018-07-30 DIAGNOSIS — O09293 Supervision of pregnancy with other poor reproductive or obstetric history, third trimester: Secondary | ICD-10-CM | POA: Diagnosis not present

## 2018-07-30 DIAGNOSIS — Z3483 Encounter for supervision of other normal pregnancy, third trimester: Secondary | ICD-10-CM

## 2018-07-30 DIAGNOSIS — O24415 Gestational diabetes mellitus in pregnancy, controlled by oral hypoglycemic drugs: Secondary | ICD-10-CM | POA: Diagnosis not present

## 2018-07-30 NOTE — Progress Notes (Signed)
   PRENATAL VISIT NOTE  Subjective:  Valerie Clark is a 28 y.o. [id number] at [redacted]w[redacted]d being seen today for ongoing prenatal care.  She is currently monitored for the following issues for this high-risk pregnancy and has History of IUFD; Low serum vitamin D; Supervision of normal pregnancy, antepartum; and Gestational diabetes mellitus (GDM) controlled on oral hypoglycemic drug on their problem list.  Patient reports labia sweeling.  Contractions: Not present. Vag. Bleeding: None.  Movement: Present. Denies leaking of fluid.   The following portions of the patient's history were reviewed and updated as appropriate: allergies, current medications, past family history, past medical history, past social history, past surgical history and problem list. Problem list updated.  Objective:   Vitals:   07/30/18 1605  BP: 120/74  Pulse: (!) 105  Weight: 170 lb 8 oz (77.3 kg)    Fetal Status: Fetal Heart Rate (bpm): 158  Fundal Height: 35 cm Movement: Present     General:  Alert, oriented and cooperative. Patient is in no acute distress.  Skin: Skin is warm and dry. No rash noted.   Cardiovascular: Normal heart rate noted  Respiratory: Normal respiratory effort, no problems with respiration noted  Abdomen: Soft, gravid, appropriate for gestational age.  Pain/Pressure: Present     Pelvic: Cervical exam deferred        Extremities: Normal range of motion.  Edema: None  Mental Status: Normal mood and affect. Normal behavior. Normal judgment and thought content.   Assessment and Plan:  Pregnancy: [id number] at [redacted]w[redacted]d  1. Supervision of other normal pregnancy, antepartum Patient is doing well without complaints Cultures next visit  2. Gestational diabetes mellitus (GDM) in third trimester controlled on oral hypoglycemic drug CBGs reviewed and all fasting within range. 3 elevated pp as high as 145 BPP done today Continue weekly antenatal testing  Preterm labor symptoms and general obstetric precautions  including but not limited to vaginal bleeding, contractions, leaking of fluid and fetal movement were reviewed in detail with the patient. Please refer to After Visit Summary for other counseling recommendations.  No follow-ups on file.  Future Appointments  Date Time Provider Department Center  08/06/2018  8:00 AM [facility] [place] 3 [facility]    Catalina Antigua, MD

## 2018-07-30 NOTE — Progress Notes (Signed)
Pt presents for ROB c/o swelling of the vulvar x 4-5 days, denies bumps/lesions/itching.

## 2018-08-06 ENCOUNTER — Ambulatory Visit (HOSPITAL_COMMUNITY)
Admission: RE | Admit: 2018-08-06 | Discharge: 2018-08-06 | Disposition: A | Discharge status: Home / Self Care | Payer: BLUE CROSS/BLUE SHIELD | Source: Ambulatory Visit | Attending: Obstetrics and Gynecology | Admitting: Obstetrics and Gynecology

## 2018-08-06 ENCOUNTER — Encounter (HOSPITAL_COMMUNITY): Payer: Self-pay

## 2018-08-06 ENCOUNTER — Other Ambulatory Visit (HOSPITAL_COMMUNITY): Payer: Self-pay | Admitting: *Deleted

## 2018-08-06 ENCOUNTER — Ambulatory Visit (HOSPITAL_COMMUNITY): Payer: BLUE CROSS/BLUE SHIELD | Admitting: *Deleted

## 2018-08-06 VITALS — BP 118/74 | HR 102 | Wt 168.8 lb

## 2018-08-06 DIAGNOSIS — O24415 Gestational diabetes mellitus in pregnancy, controlled by oral hypoglycemic drugs: Secondary | ICD-10-CM

## 2018-08-06 DIAGNOSIS — Z3A36 36 weeks gestation of pregnancy: Secondary | ICD-10-CM

## 2018-08-06 DIAGNOSIS — O09293 Supervision of pregnancy with other poor reproductive or obstetric history, third trimester: Secondary | ICD-10-CM | POA: Diagnosis not present

## 2018-08-08 ENCOUNTER — Other Ambulatory Visit (HOSPITAL_COMMUNITY)
Admission: RE | Admit: 2018-08-08 | Discharge: 2018-08-08 | Disposition: A | Discharge status: Home / Self Care | Payer: BLUE CROSS/BLUE SHIELD | Source: Ambulatory Visit | Attending: Obstetrics & Gynecology | Admitting: Obstetrics & Gynecology

## 2018-08-08 ENCOUNTER — Encounter: Payer: Self-pay | Admitting: Obstetrics & Gynecology

## 2018-08-08 ENCOUNTER — Ambulatory Visit (INDEPENDENT_AMBULATORY_CARE_PROVIDER_SITE_OTHER): Payer: BLUE CROSS/BLUE SHIELD | Admitting: Obstetrics & Gynecology

## 2018-08-08 VITALS — BP 128/79 | HR 99 | Wt 170.7 lb

## 2018-08-08 DIAGNOSIS — Z8759 Personal history of other complications of pregnancy, childbirth and the puerperium: Secondary | ICD-10-CM

## 2018-08-08 DIAGNOSIS — O24415 Gestational diabetes mellitus in pregnancy, controlled by oral hypoglycemic drugs: Secondary | ICD-10-CM | POA: Insufficient documentation

## 2018-08-08 DIAGNOSIS — O0993 Supervision of high risk pregnancy, unspecified, third trimester: Secondary | ICD-10-CM

## 2018-08-08 DIAGNOSIS — Z3A36 36 weeks gestation of pregnancy: Secondary | ICD-10-CM

## 2018-08-08 NOTE — Progress Notes (Signed)
nst

## 2018-08-08 NOTE — Patient Instructions (Signed)
Return to office for any scheduled appointments. Call the office or go to the MAU at [facility] [place] if:  You begin to have strong, frequent contractions  Your water breaks.  Sometimes it is a big gush of fluid, sometimes it is just a trickle that keeps getting your panties wet or running down your legs  You have vaginal bleeding.  It is normal to have a small amount of spotting if your cervix was checked.   You do not feel your baby moving like normal.  If you do not, get something to eat and drink and lay down and focus on feeling your baby move.   If your baby is still not moving like normal, you should call the office or go to MAU.  Any other obstetric concerns.   

## 2018-08-08 NOTE — Progress Notes (Signed)
PRENATAL VISIT NOTE  Subjective:  Valerie Clark is a 28 y.o. Z6X0960 at [redacted]w[redacted]d being seen today for ongoing prenatal care.  She is currently monitored for the following issues for this high-risk pregnancy and has History of IUFD; Low serum vitamin D; Supervision of high-risk pregnancy; and Gestational diabetes mellitus (GDM) controlled on oral hypoglycemic drug on their problem list.  Patient reports no complaints.  Contractions: Not present. Vag. Bleeding: None.  Movement: Present. Denies leaking of fluid.   The following portions of the patient's history were reviewed and updated as appropriate: allergies, current medications, past family history, past medical history, past social history, past surgical history and problem list. Problem list updated.  Objective:   Vitals:   08/08/18 1541  BP: 128/79  Pulse: 99  Weight: 170 lb 11.2 oz (77.4 kg)    Fetal Status: Fetal Heart Rate (bpm): NST Fundal Height: 37 cm Movement: Present  Presentation: Vertex  General:  Alert, oriented and cooperative. Patient is in no acute distress.  Skin: Skin is warm and dry. No rash noted.   Cardiovascular: Normal heart rate noted  Respiratory: Normal respiratory effort, no problems with respiration noted  Abdomen: Soft, gravid, appropriate for gestational age.  Pain/Pressure: Absent     Pelvic: Cervical exam performed Dilation: 1 Effacement (%): 30 Station: -3  Extremities: Normal range of motion.  Edema: None  Mental Status: Normal mood and affect. Normal behavior. Normal judgment and thought content.   Imaging: [place] Mfm Fetal Bpp Wo Non Stress  Result Date: 08/06/2018 ----------------------------------------------------------------------  OBSTETRICS REPORT                         (Signed Final 08/06/2018 04:14 pm) ---------------------------------------------------------------------- Patient Info  ID #:        [id number]                          D.O.B.:  12/31/1990 (27 yrs)  Name:        Valerie Clark                     Visit Date: 08/06/2018 08:45 am ---------------------------------------------------------------------- Performed By  Performed By:      Emeline Darling BS,      Ref. Address:      [address]                                                               [address]                                                               [place]  [address]  Attending:         Lin Landsman      [facility]                     MD                                        [facility]  Referred By:       [facility]                     [facility] ---------------------------------------------------------------------- Orders   #  Description                           Code         Ordered By   1  [place] MFM FETAL BPP WO NON               76819.01     RAVI [facility]      STRESS   2  [place] MFM OB FOLLOW UP                   76816.01     RAVI [facility]  ----------------------------------------------------------------------   #  Order #                     Accession #                 Episode #   1  [id number]                   [id number]                  [id number]   2  [id number]                   [id number]                  [id number]  ---------------------------------------------------------------------- Indications   Gestational diabetes in pregnancy, controlled   O24.415   by oral hypoglycemic drugs (metformin started   23/20)   Poor obstetric history: Previous IUFD (23       O09.299   weeks) - low risk Panorama   [redacted] weeks gestation of pregnancy                 Z3A.36  ---------------------------------------------------------------------- Vital Signs  Weight (lb):  168                               Height:        5'4"  BMI:          28.83 ---------------------------------------------------------------------- Fetal Evaluation  Num Of Fetuses:          1  Fetal Heart  Rate(bpm):   137  Cardiac Activity:        Observed  Presentation:            Cephalic  Placenta:                Posterior  P. Cord Insertion:       Previously Visualized  Amniotic Fluid  AFI FV:      Within normal limits  AFI Sum(cm)     %Tile       Largest Pocket(cm)  14.98           55          5.52  RUQ(cm)       RLQ(cm)        LUQ(cm)        LLQ(cm)  5.52          2.07           2.47           4.92 ---------------------------------------------------------------------- Biophysical Evaluation  Amniotic F.V:   Pocket => 2 cm two          F. Tone:         Observed                  planes  F. Movement:    Observed                    Score:           8/8  F. Breathing:   Observed ---------------------------------------------------------------------- Biometry  BPD:      89.4   mm     G. Age:  36w 1d         61  %    CI:         74.68  %    70 - 86                                                           FL/HC:       20.5  %    20.1 - 22.1  HC:      328.3   mm     G. Age:  37w 2d         49  %    HC/AC:       1.00       0.93 - 1.11  AC:      329.9   mm     G. Age:  36w 6d         79  %    FL/BPD:      75.3  %    71 - 87  FL:       67.3   mm     G. Age:  34w 4d         13  %    FL/AC:       20.4  %    20 - 24  Est. FW:    2900   gm      6 lb 6 oz     68  % ---------------------------------------------------------------------- OB History  Gravidity:     5         Term:  1          Prem:  1        SAB:   2  TOP:           0       Ectopic: 0         Living: 1 ---------------------------------------------------------------------- Gestational Age  LMP:            36w 1d       Date:  11/26/17  EDD:  09/02/18  U/S Today:      36w 2d                                         EDD:  09/01/18  Best:           36w 1d    Det. By:  LMP  (11/26/17)            EDD:  09/02/18 ---------------------------------------------------------------------- Anatomy  Cranium:                Previously seen        LVOT:                    Previously seen  Cavum:                  Previously seen        Aortic Arch:            Previously seen  Ventricles:             Appears normal         Ductal Arch:            Previously seen  Choroid Plexus:         Previously seen        Diaphragm:              Previously seen  Cerebellum:             Previously seen        Stomach:                Appears normal, left                                                                         sided  Posterior Fossa:        Previously seen        Abdomen:                Previously seen  Nuchal Fold:            Previously seen        Abdominal Wall:         Previously seen  Face:                   Orbits previously      Cord Vessels:           Previously seen                          seen  Lips:                   Previously seen        Kidneys:                Appear normal  Palate:                 Not well visualized    Bladder:  Appears normal  Thoracic:               Appears normal         Spine:                  Previously seen  Heart:                  Appears normal         Upper Extremities:      Previously seen                          (4CH, axis, and situs)  RVOT:                   Previously seen        Lower Extremities:      Previously seen  Other:   Heels, 5th digit, and Nasal bone previously seen. ---------------------------------------------------------------------- Cervix Uterus Adnexa  Cervix  Not visualized (advanced GA >24wks) ---------------------------------------------------------------------- Impression  Normal interval growth. ---------------------------------------------------------------------- Recommendations  Follow up growth in 3-4 weeks  Continue weekly testing with biophysical ----------------------------------------------------------------------               Lin Landsman, MD Electronically Signed Final Report   08/06/2018 04:14 pm ----------------------------------------------------------------------  [place] Mfm Fetal  Bpp Wo Non Stress  Result Date: 07/30/2018 ----------------------------------------------------------------------  OBSTETRICS REPORT                         (Signed Final 07/30/2018 04:02 pm) ---------------------------------------------------------------------- Patient Info  ID #:        [id number]                          D.O.B.:  03-14-1991 (27 yrs)  Name:        Valerie Clark                    Visit Date: 07/30/2018 09:24 am ---------------------------------------------------------------------- Performed By  Performed By:      Apolonio Schneiders Sweat         Ref. Address:      [address]                                                               [address]                                                               [place]  [address]  Attending:         Lin Landsman      [facility]                     MD                                        [facility]etal [facility]  Referred By:       [facility]                     [facility] ---------------------------------------------------------------------- Orders   #  Description                           Code         Ordered By   1  [place] MFM FETAL BPP WO NON               76819.01     [facility]VI [facility]      STRESS  ----------------------------------------------------------------------   #  Order #                     Accession #                 Episode #   1  [id number]                   [id number]                  [id number]  ---------------------------------------------------------------------- Indications   Gestational diabetes in pregnancy, controlled   O24.415   by oral hypoglycemic drugs (metformin started   23/20)   Poor obstetric history: Previous IUFD (23       O09.299   weeks) - low risk Panorama   [redacted] weeks gestation of pregnancy                 Z3A.35   ---------------------------------------------------------------------- Vital Signs                                                  Height:        5'4" ---------------------------------------------------------------------- Fetal Evaluation  Num Of Fetuses:          1  Fetal Heart Rate(bpm):   143  Cardiac Activity:        Observed  Presentation:            Cephalic  Placenta:                Posterior  P. Cord Insertion:       Previously Visualized  Amniotic Fluid  AFI FV:      Within normal limits  AFI Sum(cm)     %Tile       Largest Pocket(cm)  8.44            9           2.99  RUQ(cm)       RLQ(cm)        LUQ(cm)        LLQ(cm)  2.99  2.51           0              2.95 ---------------------------------------------------------------------- Biophysical Evaluation  Amniotic F.V:   Within normal limits        F. Tone:         Observed  F. Movement:    Observed                    Score:           8/8  F. Breathing:   Observed ---------------------------------------------------------------------- OB History  Gravidity:     5         Term:  1          Prem:  1        SAB:   2  TOP:           0       Ectopic: 0         Living: 1 ---------------------------------------------------------------------- Gestational Age  LMP:            35w 1d       Date:  11/26/17                   EDD:  09/02/18  Best:           35w 1d    Det. By:  LMP  (11/26/17)            EDD:  09/02/18 ---------------------------------------------------------------------- Anatomy  Cranium:                Previously seen        LVOT:                   Previously seen  Cavum:                  Previously seen        Aortic Arch:            Previously seen  Ventricles:             Previously seen        Ductal Arch:            Previously seen  Choroid Plexus:         Previously seen        Diaphragm:              Previously seen  Cerebellum:             Previously seen        Stomach:                Appears normal, left                                                                          sided  Posterior Fossa:        Previously seen        Abdomen:                Previously seen  Nuchal Fold:            Previously seen  Abdominal Wall:         Previously seen  Face:                   Orbits appear normal   Cord Vessels:           Previously seen  Lips:                   Previously seen        Kidneys:                Appear normal  Palate:                 Not well visualized    Bladder:                Appears normal  Thoracic:               Appears normal         Spine:                  Previously seen  Heart:                  Previously seen        Upper Extremities:      Previously seen  RVOT:                   Previously seen        Lower Extremities:      Previously seen  Other:   Heels and 5th digit previously seen. Nasal bone previously seen. ---------------------------------------------------------------------- Impression  Normal interval growth.  Biophysical profile 8/8 ---------------------------------------------------------------------- Recommendations  Continue weekly BPP/NST  Consider delivery by 39 weeks. ----------------------------------------------------------------------               Lin Landsman, MD Electronically Signed Final Report   07/30/2018 04:02 pm ----------------------------------------------------------------------  [place] Mfm Fetal Bpp Wo Non Stress  Result Date: 07/23/2018 ----------------------------------------------------------------------  OBSTETRICS REPORT                         (Signed Final 07/23/2018 11:22 am) ---------------------------------------------------------------------- Patient Info  ID #:        [id number]                          D.O.B.:  21-Aug-1990 (27 yrs)  Name:        Valerie Clark                    Visit Date: 07/23/2018 08:06 am ---------------------------------------------------------------------- Performed By  Performed By:      Percell Boston          Ref. Address:      [address]  [address]                                                               [place]                                                               [address]  Attending:         Lin Landsman      Location:          [facility]                     MD  Referred By:       [facility]                     [facility] ---------------------------------------------------------------------- Orders   #  Description                           Code         Ordered By   1  [place] MFM FETAL BPP WO NON               [id number]     RAVI [facility]      STRESS  ----------------------------------------------------------------------   #  Order #                     Accession #                 Episode #   1  [id number]                   [id number]                  [id number]  ---------------------------------------------------------------------- Indications   Poor obstetric history: Previous IUFD (23       O09.299   weeks) - low risk Panorama   Gestational diabetes in pregnancy, controlled   O24.415   by oral hypoglycemic drugs (metformin started   23/20)   [redacted] weeks gestation of pregnancy                 Z3A.34  ---------------------------------------------------------------------- Vital Signs                                                  Height:        5'4" ---------------------------------------------------------------------- Fetal Evaluation  Num Of Fetuses:          1  Fetal Heart Rate(bpm):   141  Cardiac Activity:        Observed  Presentation:            Cephalic  Amniotic Fluid  AFI FV:      Within normal limits  AFI Sum(cm)     %Tile       Largest Pocket(cm)  12.76  40          4.11  RUQ(cm)       RLQ(cm)        LUQ(cm)        LLQ(cm)  2.54          3.07           3.04           4.11 ---------------------------------------------------------------------- Biophysical Evaluation  Amniotic  F.V:   Within normal limits        F. Tone:         Observed  F. Movement:    Observed                    Score:           8/8  F. Breathing:   Observed ---------------------------------------------------------------------- OB History  Gravidity:     5         Term:  1          Prem:  1        SAB:   2  TOP:           0       Ectopic: 0         Living: 1 ---------------------------------------------------------------------- Gestational Age  LMP:            34w 1d       Date:  11/26/17                   EDD:  09/02/18  Best:           34w 1d    Det. By:  LMP  (11/26/17)            EDD:  09/02/18 ---------------------------------------------------------------------- Anatomy  Thoracic:               Appears normal         Bladder:                Appears normal  Stomach:                Appears normal, left                          sided ---------------------------------------------------------------------- Cervix Uterus Adnexa  Cervix  Not visualized (advanced GA >24wks) ---------------------------------------------------------------------- Impression  Biophysical profile 8/8 ---------------------------------------------------------------------- Recommendations  Continue weekly testing  Repeat growth in 3-4 weeks. ----------------------------------------------------------------------               Lin Landsman, MD Electronically Signed Final Report   07/23/2018 11:22 am ----------------------------------------------------------------------  [place] Mfm Fetal Bpp Wo Non Stress  Result Date: 07/17/2018 ----------------------------------------------------------------------  OBSTETRICS REPORT                         (Signed Final 07/17/2018 09:18 am) ---------------------------------------------------------------------- Patient Info  ID #:        [id number]                          D.O.B.:  [birth year] (27 yrs)  Name:        Valerie Clark                    Visit Date: 07/16/2018 11:15 am  ---------------------------------------------------------------------- Performed By  Performed By:      Hurman Horn RDMS     Ref. Address:  [address]                                                               [address]                                                               [place]                                                               [address]  Attending:         Lin Landsman      Location:          [facility]                     MD  Referred By:       [facility]                     [facility] ---------------------------------------------------------------------- Orders   #  Description                           Code         Ordered By   1  [place] MFM FETAL BPP WO NON               60454.09     RAVI [facility]      STRESS  ----------------------------------------------------------------------   #  Order #                     Accession #                 Episode #   1  [id number]                   [id number]                  [id number]  ---------------------------------------------------------------------- Indications   Poor obstetric history: Previous IUFD (23       O09.299   weeks) - low risk Panorama   Gestational diabetes in pregnancy, diet         O24.410   controlled   [redacted] weeks gestation of pregnancy                 Z3A.33  ---------------------------------------------------------------------- Vital Signs  Height:        5'4" ---------------------------------------------------------------------- Fetal Evaluation  Num Of Fetuses:          1  Cardiac Activity:        Observed  Presentation:            Cephalic  Placenta:                Posterior  P. Cord Insertion:       Visualized  Amniotic Fluid  AFI FV:      Within normal limits  AFI Sum(cm)     %Tile       Largest Pocket(cm)  10.33           20          4.49  RUQ(cm)       RLQ(cm)        LUQ(cm)         LLQ(cm)  4.49          2.71           1.4            1.73  Comment:     Bladder, diaphragm, and stomach noted. ---------------------------------------------------------------------- Biophysical Evaluation  Amniotic F.V:   Within normal limits        F. Tone:         Observed  F. Movement:    Observed                    Score:           8/8  F. Breathing:   Observed ---------------------------------------------------------------------- OB History  Gravidity:     5         Term:  1          Prem:  1        SAB:   2  TOP:           0       Ectopic: 0         Living: 1 ---------------------------------------------------------------------- Gestational Age  LMP:            33w 1d       Date:  11/26/17                   EDD:  09/02/18  Best:           33w 1d    Det. By:  LMP  (11/26/17)            EDD:  09/02/18 ---------------------------------------------------------------------- Impression  Biophysical profile 8/8 ---------------------------------------------------------------------- Recommendations  Repeat BPP in 1 week ----------------------------------------------------------------------               Lin Landsman, MD Electronically Signed Final Report   07/17/2018 09:18 am ----------------------------------------------------------------------  [place] Mfm Ob Follow Up  Result Date: 08/06/2018 ----------------------------------------------------------------------  OBSTETRICS REPORT                         (Signed Final 08/06/2018 04:14 pm) ---------------------------------------------------------------------- Patient Info  ID #:        [id number]                          D.O.B.:  03/21/1991 (27 yrs)  Name:        Valerie Clark                    Visit Date: 08/06/2018  08:45 am ---------------------------------------------------------------------- Performed By  Performed By:      Emeline DarlingKasie E Kiser BS,      Ref. Address:      [address]                     [address]                                      [address]                                                                [address]                                                               [place]                                                               [id number]  Attending:         Lin Landsmanorenthian Booker      [facility]          [facility]                     MD                                        [facility]etal [facility]  [facility] By:       [facility]                     [facility] ---------------------------------------------------------------------- Orders   #  Description                           Code         Ordered By   1  US MFM FETAL BPP WO NON               76819.01     RAVI [facility]      STRESS   2  US MFM OB FOLLOW UP                   11914.7876816.01     [facility]  ----------------------------------------------------------------------   #  Order #                     Accession #                 Episode #   1  [id number]                   [id number]  [id number]   2  [id number]                   [id number]                  [id number]  ---------------------------------------------------------------------- Indications   Gestational diabetes in pregnancy, controlled   O24.415   by oral hypoglycemic drugs (metformin started   23/20)   Poor obstetric history: Previous IUFD (23       O09.299   weeks) - low risk Panorama   [redacted] weeks gestation of pregnancy                 Z3A.36  ---------------------------------------------------------------------- Vital Signs  Weight (lb):  168                               Height:        5'4"  BMI:          28.83 ---------------------------------------------------------------------- Fetal Evaluation  Num Of Fetuses:          1  Fetal Heart Rate(bpm):   137  Cardiac Activity:        Observed  Presentation:            Cephalic  Placenta:                Posterior  P. Cord Insertion:       Previously Visualized  Amniotic Fluid  AFI FV:      Within normal limits  AFI Sum(cm)     %Tile       Largest Pocket(cm)  14.98            55          5.52  RUQ(cm)       RLQ(cm)        LUQ(cm)        LLQ(cm)  5.52          2.07           2.47           4.92 ---------------------------------------------------------------------- Biophysical Evaluation  Amniotic F.V:   Pocket => 2 cm two          F. Tone:         Observed                  planes  F. Movement:    Observed                    Score:           8/8  F. Breathing:   Observed ---------------------------------------------------------------------- Biometry  BPD:      89.4   mm     G. Age:  36w 1d         61  %    CI:         74.68  %    70 - 86                                                           FL/HC:       20.5  %    20.1 - 22.1  HC:      328.3   mm  G. Age:  37w 2d         49  %    HC/AC:       1.00       0.93 - 1.11  AC:      329.9   mm     G. Age:  36w 6d         79  %    FL/BPD:      75.3  %    71 - 87  FL:       67.3   mm     G. Age:  34w 4d         13  %    FL/AC:       20.4  %    20 - 24  Est. FW:    2900   gm      6 lb 6 oz     68  % ---------------------------------------------------------------------- OB History  Gravidity:     5         Term:  1          Prem:  1        SAB:   2  TOP:           0       Ectopic: 0         Living: 1 ---------------------------------------------------------------------- Gestational Age  LMP:            36w 1d       Date:  11/26/17                   EDD:  09/02/18  U/S Today:      36w 2d                                         EDD:  09/01/18  Best:           36w 1d    Det. By:  LMP  (11/26/17)            EDD:  09/02/18 ---------------------------------------------------------------------- Anatomy  Cranium:                Previously seen        LVOT:                   Previously seen  Cavum:                  Previously seen        Aortic Arch:            Previously seen  Ventricles:             Appears normal         Ductal Arch:            Previously seen  Choroid Plexus:         Previously seen        Diaphragm:              Previously seen   Cerebellum:             Previously seen        Stomach:                Appears normal, left  sided  Posterior Fossa:        Previously seen        Abdomen:                Previously seen  Nuchal Fold:            Previously seen        Abdominal Wall:         Previously seen  Face:                   Orbits previously      Cord Vessels:           Previously seen                          seen  Lips:                   Previously seen        Kidneys:                Appear normal  Palate:                 Not well visualized    Bladder:                Appears normal  Thoracic:               Appears normal         Spine:                  Previously seen  Heart:                  Appears normal         Upper Extremities:      Previously seen                          (4CH, axis, and situs)  RVOT:                   Previously seen        Lower Extremities:      Previously seen  Other:   Heels, 5th digit, and Nasal bone previously seen. ---------------------------------------------------------------------- Cervix Uterus Adnexa  Cervix  Not visualized (advanced GA >24wks) ---------------------------------------------------------------------- Impression  Normal interval growth. ---------------------------------------------------------------------- Recommendations  Follow up growth in 3-4 weeks  Continue weekly testing with biophysical ----------------------------------------------------------------------               Lin Landsman, MD Electronically Signed Final Report   08/06/2018 04:14 pm ----------------------------------------------------------------------   Assessment and Plan:  Pregnancy: [id number] at [redacted]w[redacted]d  1. Gestational diabetes mellitus (GDM) in third trimester controlled on oral hypoglycemic drug CBGs are within range, continue meds.  NST performed today was reviewed and was found to be reactive. Earlier BPP performed today was also  reviewed, and BPP score is 10/10. AFI was also normal. Continue recommended antenatal testing. Growth scan today EFW 60%.   IOL at 39 weeks, to be scheduled next visit.  - Culture, beta strep (group b only) - Cervicovaginal ancillary only( ) - Fetal nonstress test  2. History of IUFD Reassuring fetal testing, continue weekly testing. IOL at 39 weeks, to be scheduled next visit.   3. Supervision of high risk pregnancy in third trimester Preterm labor symptoms and general obstetric precautions including but not limited to vaginal bleeding, contractions, leaking of fluid and  fetal movement were reviewed in detail with the patient. Please refer to After Visit Summary for other counseling recommendations.  Return in about 1 week (around 08/15/2018) for NST, OB Visit.  Future Appointments  Date Time Provider Department Center  08/13/2018  8:35 AM [facility]-M[facility] NURSE WH-MFC MFC-US  08/13/2018  8:45 AM [facility]FC [place] 2 WH-MFCUS MFC-US  08/20/2018  9:00 AM [facility]-M[facility] NURSE WH-MFC MFC-US  08/20/2018  9:15 AM WH-MFC [place] 4 WH-MFCUS MFC-US  08/27/2018  8:20 AM [facility]-MFC NURSE WH-MFC MFC-US  08/27/2018  8:30 AM [facility]-MFC [place] 5 WH-MFCUS MFC-US    Jaynie Collins, MD

## 2018-08-09 LAB — CERVICOVAGINAL ANCILLARY ONLY
Chlamydia: NEGATIVE
Neisseria Gonorrhea: NEGATIVE

## 2018-08-12 LAB — CULTURE, BETA STREP (GROUP B ONLY): Strep Gp B Culture: NEGATIVE

## 2018-08-13 ENCOUNTER — Encounter (HOSPITAL_COMMUNITY): Payer: Self-pay

## 2018-08-13 ENCOUNTER — Ambulatory Visit (HOSPITAL_COMMUNITY): Payer: BLUE CROSS/BLUE SHIELD | Admitting: *Deleted

## 2018-08-13 ENCOUNTER — Ambulatory Visit (HOSPITAL_COMMUNITY)
Admission: RE | Admit: 2018-08-13 | Discharge: 2018-08-13 | Disposition: A | Discharge status: Home / Self Care | Payer: BLUE CROSS/BLUE SHIELD | Source: Ambulatory Visit | Attending: Obstetrics and Gynecology | Admitting: Obstetrics and Gynecology

## 2018-08-13 VITALS — BP 121/73 | HR 96 | Wt 171.2 lb

## 2018-08-13 DIAGNOSIS — O24415 Gestational diabetes mellitus in pregnancy, controlled by oral hypoglycemic drugs: Secondary | ICD-10-CM | POA: Insufficient documentation

## 2018-08-13 DIAGNOSIS — O09292 Supervision of pregnancy with other poor reproductive or obstetric history, second trimester: Secondary | ICD-10-CM | POA: Diagnosis not present

## 2018-08-13 DIAGNOSIS — Z3A27 27 weeks gestation of pregnancy: Secondary | ICD-10-CM

## 2018-08-14 ENCOUNTER — Other Ambulatory Visit: Payer: Self-pay

## 2018-08-14 ENCOUNTER — Ambulatory Visit (INDEPENDENT_AMBULATORY_CARE_PROVIDER_SITE_OTHER): Payer: BLUE CROSS/BLUE SHIELD | Admitting: Obstetrics & Gynecology

## 2018-08-14 VITALS — BP 122/75 | HR 103 | Wt 173.2 lb

## 2018-08-14 DIAGNOSIS — O0993 Supervision of high risk pregnancy, unspecified, third trimester: Secondary | ICD-10-CM

## 2018-08-14 DIAGNOSIS — O24415 Gestational diabetes mellitus in pregnancy, controlled by oral hypoglycemic drugs: Secondary | ICD-10-CM

## 2018-08-14 DIAGNOSIS — Z3A37 37 weeks gestation of pregnancy: Secondary | ICD-10-CM

## 2018-08-14 NOTE — Progress Notes (Signed)
Patient reports fetal movement, denies pain. 

## 2018-08-14 NOTE — Progress Notes (Signed)
   PRENATAL VISIT NOTE  Subjective:  Valerie Clark is a 28 y.o. [id number] at [redacted]w[redacted]d being seen today for ongoing prenatal care.  She is currently monitored for the following issues for this high-risk pregnancy and has History of IUFD; Low serum vitamin D; Supervision of high-risk pregnancy; and Gestational diabetes mellitus (GDM) controlled on oral hypoglycemic drug on their problem list.  Patient reports no complaints.  Contractions: Not present. Vag. Bleeding: None.  Movement: Present. Denies leaking of fluid.   The following portions of the patient's history were reviewed and updated as appropriate: allergies, current medications, past family history, past medical history, past social history, past surgical history and problem list.   Objective:   Vitals:   08/14/18 1447  BP: 122/75  Pulse: (!) 103  Weight: 173 lb 3.2 oz (78.6 kg)    Fetal Status: Fetal Heart Rate (bpm): NST   Movement: Present     General:  Alert, oriented and cooperative. Patient is in no acute distress.  Skin: Skin is warm and dry. No rash noted.   Cardiovascular: Normal heart rate noted  Respiratory: Normal respiratory effort, no problems with respiration noted  Abdomen: Soft, gravid, appropriate for gestational age.  Pain/Pressure: Absent     Pelvic: Cervical exam deferred        Extremities: Normal range of motion.  Edema: None  Mental Status: Normal mood and affect. Normal behavior. Normal judgment and thought content.   Assessment and Plan:  Pregnancy: [id number] at [redacted]w[redacted]d 1. Supervision of high risk pregnancy in third trimester NST reactive, BPP 8/8  Term labor symptoms and general obstetric precautions including but not limited to vaginal bleeding, contractions, leaking of fluid and fetal movement were reviewed in detail with the patient. Please refer to After Visit Summary for other counseling recommendations.  IOL 39 weeks Return in about 1 week (around 08/21/2018). Good BG control noted Future  Appointments  Date Time Provider Department Center  08/20/2018  9:00 AM [facility] NURSE WH-[facility]FC MFC-US  08/20/2018  9:15 AM [facility]-MFC [place] 4 WH-MFCUS MFC-US  08/27/2018  8:20 AM WH-MFC NURSE WH-MFC MFC-US  08/27/2018  8:30 AM WH-MFC [place] 5 WH-MFCUS MFC-US    Scheryl Darter, MD

## 2018-08-14 NOTE — Patient Instructions (Signed)
Labor Induction    Labor induction is when steps are taken to cause a pregnant woman to begin the labor process. Most women go into labor on their own between 37 weeks and 42 weeks of pregnancy. When this does not happen or when there is a medical need for labor to begin, steps may be taken to induce labor. Labor induction causes a pregnant woman's uterus to contract. It also causes the cervix to soften (ripen), open (dilate), and thin out (efface). Usually, labor is not induced before 39 weeks of pregnancy unless there is a medical reason to do so. Your health care provider will determine if labor induction is needed.  Before inducing labor, your health care provider will consider a number of factors, including:  · Your medical condition and your baby's.  · How many weeks along you are in your pregnancy.  · How mature your baby's lungs are.  · The condition of your cervix.  · The position of your baby.  · The size of your birth canal.  What are some reasons for labor induction?  Labor may be induced if:  · Your health or your baby's health is at risk.  · Your pregnancy is overdue by 1 week or more.  · Your water breaks but labor does not start on its own.  · There is a low amount of amniotic fluid around your baby.  You may also choose (elect) to have labor induced at a certain time. Generally, elective labor induction is done no earlier than 39 weeks of pregnancy.  What methods are used for labor induction?  Methods used for labor induction include:  · Prostaglandin medicine. This medicine starts contractions and causes the cervix to dilate and ripen. It can be taken by mouth (orally) or by being inserted into the vagina (suppository).  · Inserting a small, thin tube (catheter) with a balloon into the vagina and then expanding the balloon with water to dilate the cervix.  · Stripping the membranes. In this method, your health care provider gently separates amniotic sac tissue from the cervix. This causes the  cervix to stretch, which in turn causes the release of a hormone called progesterone. The hormone causes the uterus to contract. This procedure is often done during an office visit, after which you will be sent home to wait for contractions to begin.  · Breaking the water. In this method, your health care provider uses a small instrument to make a small hole in the amniotic sac. This eventually causes the amniotic sac to break. Contractions should begin after a few hours.  · Medicine to trigger or strengthen contractions. This medicine is given through an IV that is inserted into a vein in your arm.  Except for membrane stripping, which can be done in a clinic, labor induction is done in the hospital so that you and your baby can be carefully monitored.  How long does it take for labor to be induced?  The length of time it takes to induce labor depends on how ready your body is for labor. Some inductions can take up to 2-3 days, while others may take less than a day. Induction may take longer if:  · You are induced early in your pregnancy.  · It is your first pregnancy.  · Your cervix is not ready.  What are some risks associated with labor induction?  Some risks associated with labor induction include:  · Changes in fetal heart rate, such as being too   high, too low, or irregular (erratic).  · Failed induction.  · Infection in the mother or the baby.  · Increased risk of having a cesarean delivery.  · Fetal death.  · Breaking off (abruption) of the placenta from the uterus (rare).  · Rupture of the uterus (very rare).  When induction is needed for medical reasons, the benefits of induction generally outweigh the risks.  What are some reasons for not inducing labor?  Labor induction should not be done if:  · Your baby does not tolerate contractions.  · You have had previous surgeries on your uterus, such as a myomectomy, removal of fibroids, or a vertical scar from a previous cesarean delivery.  · Your placenta lies  very low in your uterus and blocks the opening of the cervix (placenta previa).  · Your baby is not in a head-down position.  · The umbilical cord drops down into the birth canal in front of the baby.  · There are unusual circumstances, such as the baby being very early (premature).  · You have had more than 2 previous cesarean deliveries.  Summary  · Labor induction is when steps are taken to cause a pregnant woman to begin the labor process.  · Labor induction causes a pregnant woman's uterus to contract. It also causes the cervix to ripen, dilate, and efface.  · Labor is not induced before 39 weeks of pregnancy unless there is a medical reason to do so.  · When induction is needed for medical reasons, the benefits of induction generally outweigh the risks.  This information is not intended to replace advice given to you by your health care provider. Make sure you discuss any questions you have with your health care provider.  Document Released: 10/11/2006 Document Revised: 07/05/2016 Document Reviewed: 07/05/2016  Elsevier Interactive Patient Education © 2019 [employer]

## 2018-08-19 ENCOUNTER — Telehealth (HOSPITAL_COMMUNITY): Payer: Self-pay | Admitting: *Deleted

## 2018-08-19 NOTE — Telephone Encounter (Signed)
Preadmission screen  

## 2018-08-20 ENCOUNTER — Other Ambulatory Visit: Payer: Self-pay

## 2018-08-20 ENCOUNTER — Ambulatory Visit (HOSPITAL_COMMUNITY): Payer: BLUE CROSS/BLUE SHIELD | Admitting: *Deleted

## 2018-08-20 ENCOUNTER — Encounter (HOSPITAL_COMMUNITY): Payer: Self-pay

## 2018-08-20 ENCOUNTER — Ambulatory Visit (HOSPITAL_COMMUNITY)
Admission: RE | Admit: 2018-08-20 | Discharge: 2018-08-20 | Disposition: A | Discharge status: Home / Self Care | Payer: BLUE CROSS/BLUE SHIELD | Source: Ambulatory Visit | Attending: Obstetrics and Gynecology | Admitting: Obstetrics and Gynecology

## 2018-08-20 VITALS — BP 125/75 | HR 101 | Wt 170.4 lb

## 2018-08-20 DIAGNOSIS — O24415 Gestational diabetes mellitus in pregnancy, controlled by oral hypoglycemic drugs: Secondary | ICD-10-CM

## 2018-08-20 DIAGNOSIS — Z3A38 38 weeks gestation of pregnancy: Secondary | ICD-10-CM

## 2018-08-20 DIAGNOSIS — O09299 Supervision of pregnancy with other poor reproductive or obstetric history, unspecified trimester: Secondary | ICD-10-CM

## 2018-08-21 ENCOUNTER — Other Ambulatory Visit: Payer: Self-pay | Admitting: Advanced Practice Midwife

## 2018-08-22 ENCOUNTER — Other Ambulatory Visit: Payer: Self-pay

## 2018-08-22 ENCOUNTER — Encounter: Payer: Self-pay | Admitting: Obstetrics and Gynecology

## 2018-08-22 ENCOUNTER — Other Ambulatory Visit: Payer: Self-pay | Admitting: Obstetrics & Gynecology

## 2018-08-22 ENCOUNTER — Ambulatory Visit (INDEPENDENT_AMBULATORY_CARE_PROVIDER_SITE_OTHER): Payer: BLUE CROSS/BLUE SHIELD | Admitting: Obstetrics and Gynecology

## 2018-08-22 VITALS — BP 113/72 | HR 99 | Wt 171.8 lb

## 2018-08-22 DIAGNOSIS — O0993 Supervision of high risk pregnancy, unspecified, third trimester: Secondary | ICD-10-CM | POA: Diagnosis not present

## 2018-08-22 DIAGNOSIS — O24415 Gestational diabetes mellitus in pregnancy, controlled by oral hypoglycemic drugs: Secondary | ICD-10-CM | POA: Diagnosis not present

## 2018-08-22 DIAGNOSIS — O09293 Supervision of pregnancy with other poor reproductive or obstetric history, third trimester: Secondary | ICD-10-CM

## 2018-08-22 DIAGNOSIS — Z3A38 38 weeks gestation of pregnancy: Secondary | ICD-10-CM

## 2018-08-22 DIAGNOSIS — Z8759 Personal history of other complications of pregnancy, childbirth and the puerperium: Secondary | ICD-10-CM

## 2018-08-22 NOTE — Progress Notes (Signed)
   PRENATAL VISIT NOTE  Subjective:  Valerie Clark is a 27 y.o. [id number] at [redacted]w[redacted]d being seen today for ongoing prenatal care.  She is currently monitored for the following issues for this high-risk pregnancy and has History of IUFD; Low serum vitamin D; Supervision of high-risk pregnancy; and Gestational diabetes mellitus (GDM) controlled on oral hypoglycemic drug on their problem list.  Patient reports no complaints.  Contractions: Not present. Vag. Bleeding: None.  Movement: Present. Denies leaking of fluid.   The following portions of the patient's history were reviewed and updated as appropriate: allergies, current medications, past family history, past medical history, past social history, past surgical history and problem list.   Objective:   Vitals:   08/22/18 1550  BP: 113/72  Pulse: 99  Weight: 171 lb 12.8 oz (77.9 kg)    Fetal Status: Fetal Heart Rate (bpm): NST   Movement: Present     General:  Alert, oriented and cooperative. Patient is in no acute distress.  Skin: Skin is warm and dry. No rash noted.   Cardiovascular: Normal heart rate noted  Respiratory: Normal respiratory effort, no problems with respiration noted  Abdomen: Soft, gravid, appropriate for gestational age.  Pain/Pressure: Absent     Pelvic: Cervical exam deferred        Extremities: Normal range of motion.  Edema: None  Mental Status: Normal mood and affect. Normal behavior. Normal judgment and thought content.   Assessment and Plan:  Pregnancy: [id number] at [redacted]w[redacted]d 1. Supervision of high risk pregnancy in third trimester Patient is doing well - Fetal nonstress test  2. Gestational diabetes mellitus (GDM) in third trimester controlled on oral hypoglycemic drug CBgs reviewed and all within range 3/17 BPP 8/8 NST today reviewed and reactive with baseline 130, mod variability, +accels, no decels Patient scheduled for IOL on 3/23 - Fetal nonstress test  3. History of IUFD Normal antenatal testing  Scheduled for IOL at 39 weeks  Term labor symptoms and general obstetric precautions including but not limited to vaginal bleeding, contractions, leaking of fluid and fetal movement were reviewed in detail with the patient. Please refer to After Visit Summary for other counseling recommendations.   Return in about 6 weeks (around 10/03/2018) for postpartum.  Future Appointments  Date Time Provider Department Center  08/26/2018  7:00 AM [facility] SCHED ROOM [facility] None    Catalina Antigua, MD

## 2018-08-22 NOTE — Progress Notes (Signed)
Pt is here for ROB/NST. [redacted]w[redacted]d.

## 2018-08-23 ENCOUNTER — Other Ambulatory Visit (HOSPITAL_COMMUNITY): Payer: Self-pay | Admitting: *Deleted

## 2018-08-24 ENCOUNTER — Other Ambulatory Visit: Payer: Self-pay | Admitting: Advanced Practice Midwife

## 2018-08-26 ENCOUNTER — Encounter (HOSPITAL_COMMUNITY): Payer: Self-pay | Admitting: *Deleted

## 2018-08-26 ENCOUNTER — Other Ambulatory Visit: Payer: Self-pay

## 2018-08-26 ENCOUNTER — Inpatient Hospital Stay (HOSPITAL_COMMUNITY): Payer: BLUE CROSS/BLUE SHIELD

## 2018-08-26 ENCOUNTER — Inpatient Hospital Stay (HOSPITAL_COMMUNITY)
Admission: AD | Admit: 2018-08-26 | Discharge: 2018-08-28 | DRG: 805 | Disposition: A | Discharge status: Home / Self Care | Payer: BLUE CROSS/BLUE SHIELD | Attending: Obstetrics and Gynecology | Admitting: Obstetrics and Gynecology

## 2018-08-26 DIAGNOSIS — O411230 Chorioamnionitis, third trimester, not applicable or unspecified: Secondary | ICD-10-CM | POA: Diagnosis present

## 2018-08-26 DIAGNOSIS — Z3A39 39 weeks gestation of pregnancy: Secondary | ICD-10-CM

## 2018-08-26 DIAGNOSIS — Z8759 Personal history of other complications of pregnancy, childbirth and the puerperium: Secondary | ICD-10-CM

## 2018-08-26 DIAGNOSIS — O24415 Gestational diabetes mellitus in pregnancy, controlled by oral hypoglycemic drugs: Secondary | ICD-10-CM | POA: Diagnosis present

## 2018-08-26 DIAGNOSIS — O0993 Supervision of high risk pregnancy, unspecified, third trimester: Secondary | ICD-10-CM

## 2018-08-26 DIAGNOSIS — O09291 Supervision of pregnancy with other poor reproductive or obstetric history, first trimester: Secondary | ICD-10-CM | POA: Diagnosis present

## 2018-08-26 DIAGNOSIS — O0990 Supervision of high risk pregnancy, unspecified, unspecified trimester: Secondary | ICD-10-CM

## 2018-08-26 DIAGNOSIS — Z8632 Personal history of gestational diabetes: Secondary | ICD-10-CM | POA: Diagnosis present

## 2018-08-26 DIAGNOSIS — O24425 Gestational diabetes mellitus in childbirth, controlled by oral hypoglycemic drugs: Principal | ICD-10-CM | POA: Diagnosis present

## 2018-08-26 LAB — CBC
HCT: 37.6 % (ref 36.0–46.0)
Hemoglobin: 13.1 g/dL (ref 12.0–15.0)
MCH: 29.2 pg (ref 26.0–34.0)
MCHC: 34.8 g/dL (ref 30.0–36.0)
MCV: 83.9 fL (ref 80.0–100.0)
NRBC: 0 % (ref 0.0–0.2)
Platelets: 339 10*3/uL (ref 150–400)
RBC: 4.48 MIL/uL (ref 3.87–5.11)
RDW: 13.9 % (ref 11.5–15.5)
WBC: 13.9 10*3/uL — ABNORMAL HIGH (ref 4.0–10.5)

## 2018-08-26 LAB — RPR: RPR Ser Ql: NONREACTIVE

## 2018-08-26 LAB — GLUCOSE, CAPILLARY
Glucose-Capillary: 101 mg/dL — ABNORMAL HIGH (ref 70–99)
Glucose-Capillary: 140 mg/dL — ABNORMAL HIGH (ref 70–99)

## 2018-08-26 LAB — TYPE AND SCREEN
ABO/RH(D): B POS
Antibody Screen: NEGATIVE

## 2018-08-26 LAB — ABO/RH: ABO/RH(D): B POS

## 2018-08-26 MED ORDER — FENTANYL CITRATE (PF) 100 MCG/2ML IJ SOLN
100.0000 ug | INTRAMUSCULAR | Status: DC | PRN
  Administered 2018-08-26 (×3): 100 ug via INTRAVENOUS
  Filled 2018-08-26 (×3): qty 2

## 2018-08-26 MED ORDER — LACTATED RINGERS IV SOLN: 500.0000 mL | INTRAVENOUS | Status: DC | PRN

## 2018-08-26 MED ORDER — WITCH HAZEL-GLYCERIN EX PADS: 1.0000 "application " | MEDICATED_PAD | CUTANEOUS | Status: DC | PRN

## 2018-08-26 MED ORDER — MISOPROSTOL 50MCG HALF TABLET
ORAL_TABLET | ORAL | Status: AC
  Filled 2018-08-26: qty 1

## 2018-08-26 MED ORDER — BENZOCAINE-MENTHOL 20-0.5 % EX AERO
1.0000 "application " | INHALATION_SPRAY | CUTANEOUS | Status: DC | PRN
  Administered 2018-08-26: 1 via TOPICAL
  Filled 2018-08-26: qty 56

## 2018-08-26 MED ORDER — TERBUTALINE SULFATE 1 MG/ML IJ SOLN: 0.2500 mg | Freq: Once | INTRAMUSCULAR | Status: DC | PRN

## 2018-08-26 MED ORDER — SIMETHICONE 80 MG PO CHEW: 80.0000 mg | CHEWABLE_TABLET | ORAL | Status: DC | PRN

## 2018-08-26 MED ORDER — ONDANSETRON HCL 4 MG/2ML IJ SOLN: 4.0000 mg | INTRAMUSCULAR | Status: DC | PRN

## 2018-08-26 MED ORDER — TETANUS-DIPHTH-ACELL PERTUSSIS 5-2.5-18.5 LF-MCG/0.5 IM SUSP: 0.5000 mL | Freq: Once | INTRAMUSCULAR | Status: DC

## 2018-08-26 MED ORDER — OXYTOCIN 40 UNITS IN NORMAL SALINE INFUSION - SIMPLE MED
2.5000 [IU]/h | INTRAVENOUS | Status: DC
  Filled 2018-08-26: qty 1000

## 2018-08-26 MED ORDER — PRENATAL MULTIVITAMIN CH
1.0000 | ORAL_TABLET | Freq: Every day | ORAL | Status: DC
  Administered 2018-08-27 – 2018-08-28 (×2): 1 via ORAL
  Filled 2018-08-26 (×2): qty 1

## 2018-08-26 MED ORDER — ONDANSETRON HCL 4 MG/2ML IJ SOLN
4.0000 mg | Freq: Four times a day (QID) | INTRAMUSCULAR | Status: DC | PRN
  Administered 2018-08-26: 4 mg via INTRAVENOUS
  Filled 2018-08-26: qty 2

## 2018-08-26 MED ORDER — ONDANSETRON HCL 4 MG PO TABS: 4.0000 mg | ORAL_TABLET | ORAL | Status: DC | PRN

## 2018-08-26 MED ORDER — ACETAMINOPHEN 325 MG PO TABS
650.0000 mg | ORAL_TABLET | ORAL | Status: DC | PRN
  Administered 2018-08-26: 650 mg via ORAL
  Filled 2018-08-26: qty 2

## 2018-08-26 MED ORDER — COCONUT OIL OIL: 1.0000 "application " | TOPICAL_OIL | Status: DC | PRN

## 2018-08-26 MED ORDER — SENNOSIDES-DOCUSATE SODIUM 8.6-50 MG PO TABS
2.0000 | ORAL_TABLET | ORAL | Status: DC
  Administered 2018-08-27 (×2): 2 via ORAL
  Filled 2018-08-26 (×2): qty 2

## 2018-08-26 MED ORDER — DIPHENHYDRAMINE HCL 25 MG PO CAPS: 25.0000 mg | ORAL_CAPSULE | Freq: Four times a day (QID) | ORAL | Status: DC | PRN

## 2018-08-26 MED ORDER — MISOPROSTOL 50MCG HALF TABLET
50.0000 ug | ORAL_TABLET | ORAL | Status: DC
  Administered 2018-08-26: 50 ug via BUCCAL

## 2018-08-26 MED ORDER — SOD CITRATE-CITRIC ACID 500-334 MG/5ML PO SOLN: 30.0000 mL | ORAL | Status: DC | PRN

## 2018-08-26 MED ORDER — MISOPROSTOL 25 MCG QUARTER TABLET
ORAL_TABLET | ORAL | Status: AC
  Filled 2018-08-26: qty 1

## 2018-08-26 MED ORDER — IBUPROFEN 600 MG PO TABS
600.0000 mg | ORAL_TABLET | Freq: Four times a day (QID) | ORAL | Status: DC
  Administered 2018-08-27 – 2018-08-28 (×7): 600 mg via ORAL
  Filled 2018-08-26 (×7): qty 1

## 2018-08-26 MED ORDER — OXYTOCIN BOLUS FROM INFUSION
500.0000 mL | Freq: Once | INTRAVENOUS | Status: AC
  Administered 2018-08-26: 500 mL via INTRAVENOUS

## 2018-08-26 MED ORDER — LIDOCAINE HCL (PF) 1 % IJ SOLN
30.0000 mL | INTRAMUSCULAR | Status: AC | PRN
  Administered 2018-08-26: 30 mL via SUBCUTANEOUS
  Filled 2018-08-26: qty 30

## 2018-08-26 MED ORDER — OXYCODONE-ACETAMINOPHEN 5-325 MG PO TABS: 2.0000 | ORAL_TABLET | ORAL | Status: DC | PRN

## 2018-08-26 MED ORDER — ACETAMINOPHEN 325 MG PO TABS: 650.0000 mg | ORAL_TABLET | ORAL | Status: DC | PRN

## 2018-08-26 MED ORDER — ACETAMINOPHEN 500 MG PO TABS
1000.0000 mg | ORAL_TABLET | Freq: Once | ORAL | Status: AC
  Administered 2018-08-26: 1000 mg via ORAL
  Filled 2018-08-26: qty 2

## 2018-08-26 MED ORDER — OXYCODONE-ACETAMINOPHEN 5-325 MG PO TABS: 1.0000 | ORAL_TABLET | ORAL | Status: DC | PRN

## 2018-08-26 MED ORDER — GENTAMICIN SULFATE 40 MG/ML IJ SOLN
5.0000 mg/kg | INTRAVENOUS | Status: DC
  Administered 2018-08-26: 320 mg via INTRAVENOUS
  Filled 2018-08-26: qty 8

## 2018-08-26 MED ORDER — DIBUCAINE 1 % RE OINT: 1.0000 "application " | TOPICAL_OINTMENT | RECTAL | Status: DC | PRN

## 2018-08-26 MED ORDER — SODIUM CHLORIDE 0.9 % IV SOLN
2.0000 g | Freq: Four times a day (QID) | INTRAVENOUS | Status: DC
  Administered 2018-08-26: 2 g via INTRAVENOUS
  Filled 2018-08-26 (×3): qty 2000

## 2018-08-26 MED ORDER — LACTATED RINGERS IV SOLN
INTRAVENOUS | Status: DC
  Administered 2018-08-26 (×2): via INTRAVENOUS

## 2018-08-26 MED ORDER — MISOPROSTOL 25 MCG QUARTER TABLET: 25.0000 ug | ORAL_TABLET | ORAL | Status: DC | PRN

## 2018-08-26 NOTE — H&P (Addendum)
LABOR AND DELIVERY ADMISSION HISTORY AND PHYSICAL NOTE  Valerie Clark is a 28 y.o. female [phone] with IUP at 42w0dby LMP presenting for IOL for A2GDM. EFW 2900g (6#6oz), 68% at 36 weeks. Largest baby 3110g at 350w  She reports positive fetal movement. She denies leakage of fluid or vaginal bleeding.  [facility]:  - AL3TDSon metformin  - H/o IUFD at 20w (2016) - Low Vit D  Past Medical History: Past Medical History:  Diagnosis Date  . Diabetes mellitus without complication ([place]   . Medical history non-contributory     Past Surgical History: Past Surgical History:  Procedure Laterality Date  . NO PAST SURGERIES      Obstetrical History: OB History    Gravida  4   Para  2   Term  1   Preterm  1   AB  1   Living  1     SAB  1   TAB      Ectopic      Multiple  0   Live Births  1           Social History: Social History   Socioeconomic History  . Marital status: Married    Spouse name: Not on file  . Number of children: Not on file  . Years of education: Not on file  . Highest education level: Not on file  Occupational History  . Not on file  Social Needs  . Financial resource strain: Not hard at all  . Food insecurity:    Worry: Never true    Inability: Never true  . Transportation needs:    Medical: No    Non-medical: No  Tobacco Use  . Smoking status: Never Smoker  . Smokeless tobacco: Never Used  Substance and Sexual Activity  . Alcohol use: No  . Drug use: No  . Sexual activity: Yes    Birth control/protection: None  Lifestyle  . Physical activity:    Days per week: 0 days    Minutes per session: 0 min  . Stress: Only a little  Relationships  . Social connections:    Talks on phone: Twice a week    Gets together: Twice a week    Attends religious service: More than 4 times per year    Active member of club or organization: Yes    Attends meetings of clubs or  organizations: Never    Relationship status: Married  Other Topics Concern  . Not on file  Social History Narrative  . Not on file    Family History: Family History  Family history unknown: Yes    Allergies: No Known Allergies  Medications Prior to Admission  Medication Sig Dispense Refill Last Dose  . ACCU-CHEK FASTCLIX LANCETS MISC 1 Device by Percutaneous route 4 (four) times daily. 100 each 12  at Unknown time  . Blood Glucose Monitoring Suppl (ACCU-CHEK GUIDE ME) w/Device KIT 1 kit by Does not apply route 4 (four) times daily. 1 kit 0  at Unknown time  . glucose blood test strip Use as instructed 100 each 12  at Unknown time  . metFORMIN (GLUCOPHAGE) 1000 MG tablet Take 1 tablet (1,000 mg total) by mouth 2 (two) times daily with a meal. 60 tablet 2 08/25/2018 at Unknown time  . Prenat w/o A Vit-FeFum-FePo-FA (CONCEPT OB) 130-92.4-1 MG CAPS Take 1 capsule by mouth daily. 30 capsule 11 08/25/2018 at Unknown time  . Elastic Bandages &  Supports (COMFORT FIT MATERNITY SUPP MED) MISC Wear daily when ambulating 1 each 0 Taking     Review of Systems  All systems reviewed and negative except as stated in HPI  Physical Exam Blood pressure 132/84, pulse 97, temperature 98.3 F (36.8 C), temperature source Oral, resp. rate 16, height '5\' 4"'$  (1.626 m), weight 78 kg, last menstrual period 11/26/2017, currently breastfeeding. General appearance: alert, oriented, NAD Lungs: normal respiratory effort Heart: regular rate Abdomen: soft, non-tender; gravid, FH appropriate for GA Extremities: No calf swelling or tenderness Presentation: cephalic Fetal monitoring: baseline 150bpm, moderate variability, + acels, no decels Uterine activity: Ctx q 3-6 mins Dilation: 2 Effacement (%): 70 Station: -2 Exam by:: mullis  Prenatal labs: ABO, Rh: --/--/B POS (03/23 0745) Antibody: NEG (03/23 0745) Rubella: 13.10 (09/04 1648) RPR: Non Reactive (12/23 1022)  HBsAg: Negative (09/04 1648)  HIV: Non  Reactive (12/23 1022)  GC/Chlamydia: Negative GBS:   Negative 2-hr GTT: Abnormal (101/192/168) Genetic screening:  NIPS: low risk female, AFP: neg Anatomy US: Normal  Prenatal Transfer Tool  Maternal Diabetes: Yes:  Diabetes Type:  Insulin/Medication controlled Genetic Screening: Normal Maternal Ultrasounds/Referrals: Normal Fetal Ultrasounds or other Referrals:  None Maternal Substance Abuse:  No Significant Maternal Medications:  Meds include: Other: Metformin Significant Maternal Lab Results: Lab values include: Group B Strep negative  Results for orders placed or performed during the hospital encounter of 08/26/18 (from the past 24 hour(s))  CBC   Collection Time: 08/26/18  7:45 AM  Result Value Ref Range   WBC 13.9 (H) 4.0 - 10.5 K/uL   RBC 4.48 3.87 - 5.11 MIL/uL   Hemoglobin 13.1 12.0 - 15.0 g/dL   HCT 37.6 36.0 - 46.0 %   MCV 83.9 80.0 - 100.0 fL   MCH 29.2 26.0 - 34.0 pg   MCHC 34.8 30.0 - 36.0 g/dL   RDW 13.9 11.5 - 15.5 %   Platelets 339 150 - 400 K/uL   nRBC 0.0 0.0 - 0.2 %  Type and screen   Collection Time: 08/26/18  7:45 AM  Result Value Ref Range   ABO/RH(D) B POS    Antibody Screen NEG    Sample Expiration      08/29/2018 Performed at [facility], [place] [address], [place], [place] [address]     Patient Active Problem List   Diagnosis Date Noted  . Supervision of high risk pregnancy, antepartum 08/26/2018  . Gestational diabetes mellitus (GDM) controlled on oral hypoglycemic drug 06/10/2018  . Supervision of high-risk pregnancy 02/06/2018  . Low serum vitamin D 05/12/2016  . History of IUFD 05/08/2016    Assessment: Valerie Clark is a 28 y.o. [id number] at 73w0dhere for IOL for A2GDM.  #Labor: buccal Cyto #1 and foley bulb placed. AROM at [phone] clear fluid during placement of FB.  #Pain: Desires IV pain meds - upon maternal request #FWB: Cat I #ID:  GBS neg, GC/Cl neg  #A2GDM: CBG q4 hours while in latent labor, q2 hours while in active  labor. Hold Metformin.  #Postpartum Planning:  -- '[x]'$  flu / [no] TDAP / Rubella immune -- Girl / both / [place] DO 08/26/2018, 10:43 AM   Attestation: I have seen this patient and agree with the resident's documentation. I have examined them separately, and we have discussed the plan of Clark.  LLambert Mody WJuleen China DO OB/GYN Fellow

## 2018-08-26 NOTE — Progress Notes (Signed)
LABOR PROGRESS NOTE  Rudine Evelene Croon is a 28 y.o. [id number] at [redacted]w[redacted]d  admitted for IOL for A2GDM  Subjective: Patient feeling stronger contractions. Having some back labor. Also experiencing some nausea and vomiting.   Objective: BP (!) 129/58   Pulse (!) 119   Temp (!) 100.6 F (38.1 C) (Axillary)   Resp 20   Ht 5\' 4"  (1.626 m)   Wt 78 kg   LMP 11/26/2017 (Exact Date)   BMI 29.52 kg/m  or  Vitals:   08/26/18 1332 08/26/18 1359 08/26/18 1426 08/26/18 1445  BP: 125/73 137/74 (!) 129/58   Pulse: (!) 129 (!) 118 (!) 119   Resp: 18 20 20    Temp:    (!) 100.6 F (38.1 C)  TempSrc:    Axillary  Weight:      Height:        Dilation: 6 Effacement (%): 70 Cervical Position: Posterior Station: -2 Presentation: Vertex Exam by:: Ardis Hughs FHT: baseline rate 160, moderate varibility, +acel, no decel Toco: ctx q1-4 mins  Labs: Lab Results  Component Value Date   WBC 13.9 (H) 08/26/2018   HGB 13.1 08/26/2018   HCT 37.6 08/26/2018   MCV 83.9 08/26/2018   PLT 339 08/26/2018    Patient Active Problem List   Diagnosis Date Noted  . Supervision of high risk pregnancy, antepartum 08/26/2018  . Gestational diabetes mellitus (GDM) controlled on oral hypoglycemic drug 06/10/2018  . Supervision of high-risk pregnancy 02/06/2018  . Low serum vitamin D 05/12/2016  . History of IUFD 05/08/2016    Assessment / Plan: 28 y.o. [id number] at [redacted]w[redacted]d here for IOL for A2GDM.  Elevated Temperature: Temp to 100.6. Suspected 2/2 to chorioamnionitis. Will give 1g Tylenol x 1. Start Amp 2g q6 and Gentamicin 5mg /kg q24 hours  Labor: AROMed forebag. Patient contracting well at this time. Will continue to monitor for another hour. Given new onset chorio, if no progress, will begin pitocin. Continue to rotate with peanut.  Fetal Wellbeing:  Cat I Pain Control:  IV pain meds upon request Anticipated MOD:  NSVD   Orpah Cobb, D.O. [facility], PGY1 08/26/2018, 2:49 PM

## 2018-08-26 NOTE — Discharge Summary (Signed)
Obstetrics Discharge Summary OB/GYN Faculty Practice   Patient Name: Valerie Clark DOB: 12/26/1990 MRN: [id number]  Date of admission: 08/26/2018 Delivering MD: MULLIS, KIERSTEN P   Date of discharge: 08/28/2018  Admitting diagnosis: Pregnancy Intrauterine pregnancy: [redacted]w[redacted]d     Secondary diagnosis:   Active Problems:   History of IUFD   Gestational diabetes mellitus (GDM) controlled on oral hypoglycemic drug   Supervision of high risk pregnancy, antepartum  Additional problems:  . Chorioamnionitis     Discharge diagnosis: Term Pregnancy Delivered and GDM A2                                            Postpartum procedures: None none Complications: Chorioamnionitis  Outpatient Follow-Up: 4 weeks at [facility] office  Hospital course: Valerie Clark is a 27 y.o. [redacted]w[redacted]d who was admitted for IOL for A2GDM. Her pregnancy was complicated by A2GDM on metformin and h/o IUFD at 20w. Her labor course was notable for foley bulb, AROM, and pitocin. Patient developed intrapartum fever concerning for chorioamnionitis. Patient received intrapartum Amp and Gent. Delivery was complicated by 2nd degree perineal tear. Please see delivery/op note for additional details. Her postpartum course was uncomplicated. She was breastfeeding without difficulty. By day of discharge, she was passing flatus, urinating, eating and drinking without difficulty. Her pain was well-controlled, and she was discharged home with baby.. She will follow-up in clinic in 4-6 weeks for postpartum visit.   Physical exam  Vitals:   08/27/18 1145 08/27/18 1504 08/27/18 2348 08/28/18 0618  BP: (!) 95/58 110/74 118/80 102/68  Pulse: 62 83 87 73  Resp: 18 19 18   Temp: 97.9 F (36.6 C) 97.7 F (36.5 C) 97.9 F (36.6 C)   TempSrc: Oral Oral Oral   SpO2:  96%  99%  Weight:      Height:       General: Alert and oriented. No distress.  No dizziness or nausea Lochia: appropriate Uterine Fundus: firm Incision: N/A DVT Evaluation: No  evidence of DVT seen on physical exam. Labs: Lab Results  Component Value Date   WBC 13.9 (H) 08/26/2018   HGB 13.1 08/26/2018   HCT 37.6 08/26/2018   MCV 83.9 08/26/2018   PLT 339 08/26/2018   CMP Latest Ref Rng & Units 04/06/2016  Glucose 65 - 99 mg/dL 93  BUN 6 - 20 mg/dL 7  Creatinine 0.44 - 1.00 mg/dL 0.52  Sodium 135 - 145 mmol/L 135  Potassium 3.5 - 5.1 mmol/L 3.6  Chloride 101 - 111 mmol/L 105  CO2 22 - 32 mmol/L 24  Calcium 8.9 - 10.3 mg/dL 9.3    Discharge instructions: Per After Visit Summary and "Baby and Me Booklet"  After visit meds:  Allergies as of 08/28/2018   No Known Allergies     Medication List    STOP taking these medications   Accu-Chek FastClix Lancets Misc   Accu-Chek Guide Me w/Device Kit   Comfort Fit Maternity Supp Med Misc   glucose blood test strip   metFORMIN 1000 MG tablet Commonly known as:  GLUCOPHAGE     TAKE these medications   Concept OB 130-92.4-1 MG Caps Take 1 capsule by mouth daily.   ibuprofen 600 MG tablet Commonly known as:  ADVIL,MOTRIN Take 1 tablet (600 mg total) by mouth every 6 (six) hours.       Postpartum contraception: IUD Paragard Diet: Routine   Diet Activity: Advance as tolerated. Pelvic rest for 6 weeks.   Follow-up Appt: Future Appointments  Date Time Provider Department Center  09/23/2018  3:30 PM Constant, Peggy, MD [facility] None   Follow-up Visit:No follow-ups on file.  Please schedule this patient for Postpartum visit in: 4 weeks with the following provider: Any provider High risk pregnancy complicated by: GDM Delivery mode:  SVD Anticipated Birth Control:  other/unsure PP Procedures needed: 2 hour GTT  Schedule Integrated BH visit: no  Newborn Data: Live born female  Birth Weight:   APGAR: 8, 9  Newborn Delivery   Birth date/time:  08/26/2018 16:40:00 Delivery type:  Vaginal, Spontaneous     Baby Feeding: Breast Disposition:home with mother  Laurel S. Wallace, DO OB/GYN Fellow,  Faculty Practice      

## 2018-08-27 ENCOUNTER — Other Ambulatory Visit (HOSPITAL_COMMUNITY): Payer: BLUE CROSS/BLUE SHIELD

## 2018-08-27 ENCOUNTER — Ambulatory Visit (HOSPITAL_COMMUNITY): Payer: BLUE CROSS/BLUE SHIELD

## 2018-08-27 NOTE — Progress Notes (Signed)
Post Partum Day 1 Subjective: no complaints, up ad lib, voiding and tolerating PO. Denies fever and chills.   Objective: Blood pressure 112/70, pulse 81, temperature 98 F (36.7 C), temperature source Oral, resp. rate 16, height 5\' 4"  (1.626 m), weight 78 kg, last menstrual period 11/26/2017, SpO2 99 %, unknown if currently breastfeeding.  Physical Exam:  General: alert, cooperative and no distress Lochia: appropriate Uterine Fundus: firm DVT Evaluation: No cords or calf tenderness. No significant calf/ankle edema.  Recent Labs    08/26/18 0745  HGB 13.1  HCT 37.6   Assessment/Plan: Plan for discharge tomorrow, Breastfeeding and Contraception - Paraguard  Received intrapartum ampicillin and gentamycin for chorioamnionitis - has not been febrile since delivery, tachycardia resolved shortly after delivery. Will continue to monitor.    LOS: 1 day   Burman Nieves, MD Family Medicine Resident  08/27/2018, 6:54 AM

## 2018-08-28 MED ORDER — IBUPROFEN 600 MG PO TABS: 600.0000 mg | ORAL_TABLET | Freq: Four times a day (QID) | ORAL | 0 refills | Status: DC

## 2018-08-28 NOTE — Discharge Instructions (Signed)
Breastfeeding Tips for a Good Latch Latching is how your baby's mouth attaches to your nipple to breastfeed. It is an important part of breastfeeding. Your baby may have trouble latching for a number of reasons. A poor latch may cause you to have cracked or sore nipples or other problems. Follow these instructions at home: How to position your baby  Find a comfortable place to sit or lie down. Your neck and back should be well supported.  If you are seated, place a pillow or rolled-up blanket under your baby. This will bring him or her to the level of your breast.  Make sure that your baby's belly (abdomen) is facing your belly.  Try different positions to find one that works best for you and your baby. How to help your baby latch   To start, gently rub your breast. Move your fingertips in a circle as you massage from your chest wall toward your nipple. This helps milk flow. Keep doing this during feeding if needed.  Position your breast. Hold your breast with four fingers underneath and your thumb above your nipple. Keep your fingers away from your nipple and your baby's mouth. Follow these steps to help your baby latch: 1. Rub your baby's lips gently with your finger or nipple. 2. When your baby's mouth is open wide enough, quickly bring your baby to your breast and place your whole nipple into your baby's mouth. Place as much of the colored area around your nipple (areola)as possible into your baby's mouth. 3. Your baby's tongue should be between his or her lower gum and your breast. 4. You should be able to see more areola above your baby's upper lip than below the lower lip. 5. When your baby starts sucking, you will feel a gentle pull on your nipple. You should not feel any pain. Be patient. It is common for a baby to suck for about 2-3 minutes to start the flow of breast milk. 6. Make sure that your baby's mouth is in the right position around your nipple. Your baby's lips should  make a seal on your breast and be turned outward.  General instructions  Look for these signs that your baby has latched on to your nipple: ? The baby is quietly tugging or sucking without causing you pain. ? You hear the baby swallow after every 3 or 4 sucks. ? You see movement above and in front of the baby's ears while he or she is sucking.  Be aware of these signs that your baby has not latched on to your nipple: ? The baby makes sucking sounds or smacking sounds while feeding. ? You have nipple pain.  If your baby is not latched well, put your little finger between your baby's gums and your nipple. This will break the seal. Then try to help your baby latch again.  If you keep having problems, get help from a breastfeeding specialist (Advertising copywriter). Contact a doctor if:  You have cracking or soreness in your nipples that lasts longer than 1 week.  You have nipple pain.  Your breasts are filled with too much milk (engorgement), and this does not improve after 48-72 hours.  You have a plugged milk duct and a fever.  You follow the tips for a good latch but you keep having problems or concerns.  You have a pus-like fluid coming from your breast.  Your baby is not gaining weight.  Your baby loses weight. Summary  Latching is how  your baby's mouth attaches to your nipple to breastfeed.  Try different positions for breastfeeding to find one that works best for you and your baby.  A poor latch may cause you to have cracked or sore nipples or other problems. This information is not intended to replace advice given to you by your health care provider. Make sure you discuss any questions you have with your health care provider. Document Released: 12/27/2016 Document Revised: 12/27/2016 Document Reviewed: 12/27/2016 Elsevier Interactive Patient Education  2019 Elsevier Inc[employer] Vaginal Delivery, Care After Refer to this sheet in the next few weeks. These instructions provide  you with information about caring for yourself after vaginal delivery. Your health care provider may also give you more specific instructions. Your treatment has been planned according to current medical practices, but problems sometimes occur. Call your health care provider if you have any problems or questions. What can I expect after the procedure? After vaginal delivery, it is common to have:  Some bleeding from your vagina.  Soreness in your abdomen, your vagina, and the area of skin between your vaginal opening and your anus (perineum).  Pelvic cramps.  Fatigue. Follow these instructions at home: Medicines  Take over-the-counter and prescription medicines only as told by your health care provider.  If you were prescribed an antibiotic medicine, take it as told by your health care provider. Do not stop taking the antibiotic until it is finished. Driving   Do not drive or operate heavy machinery while taking prescription pain medicine.  Do not drive for 24 hours if you received a sedative. Lifestyle  Do not drink alcohol. This is especially important if you are breastfeeding or taking medicine to relieve pain.  Do not use tobacco products, including cigarettes, chewing tobacco, or e-cigarettes. If you need help quitting, ask your health care provider. Eating and drinking  Drink at least 8 eight-ounce glasses of water every day unless you are told not to by your health care provider. If you choose to breastfeed your baby, you may need to drink more water than this.  Eat high-fiber foods every day. These foods may help prevent or relieve constipation. High-fiber foods include: ? Whole grain cereals and breads. ? Brown rice. ? Beans. ? Fresh fruits and vegetables. Activity  Return to your normal activities as told by your health care provider. Ask your health care provider what activities are safe for you.  Rest as much as possible. Try to rest or take a nap when your baby  is sleeping.  Do not lift anything that is heavier than your baby or 10 lb (4.5 kg) until your health care provider says that it is safe.  Talk with your health care provider about when you can engage in sexual activity. This may depend on your: ? Risk of infection. ? Rate of healing. ? Comfort and desire to engage in sexual activity. Vaginal Care  If you have an episiotomy or a vaginal tear, check the area every day for signs of infection. Check for: ? More redness, swelling, or pain. ? More fluid or blood. ? Warmth. ? Pus or a bad smell.  Do not use tampons or douches until your health care provider says this is safe.  Watch for any blood clots that may pass from your vagina. These may look like clumps of dark red, brown, or black discharge. General instructions  Keep your perineum clean and dry as told by your health care provider.  Wear loose, comfortable clothing.  Wipe  from front to back when you use the toilet.  Ask your health care provider if you can shower or take a bath. If you had an episiotomy or a perineal tear during labor and delivery, your health care provider may tell you not to take baths for a certain length of time.  Wear a bra that supports your breasts and fits you well.  If possible, have someone help you with household activities and help care for your baby for at least a few days after you leave the hospital.  Keep all follow-up visits for you and your baby as told by your health care provider. This is important. Contact a health care provider if:  You have: ? Vaginal discharge that has a bad smell. ? Difficulty urinating. ? Pain when urinating. ? A sudden increase or decrease in the frequency of your bowel movements. ? More redness, swelling, or pain around your episiotomy or vaginal tear. ? More fluid or blood coming from your episiotomy or vaginal tear. ? Pus or a bad smell coming from your episiotomy or vaginal tear. ? A fever. ? A  rash. ? Little or no interest in activities you used to enjoy. ? Questions about caring for yourself or your baby.  Your episiotomy or vaginal tear feels warm to the touch.  Your episiotomy or vaginal tear is separating or does not appear to be healing.  Your breasts are painful, hard, or turn red.  You feel unusually sad or worried.  You feel nauseous or you vomit.  You pass large blood clots from your vagina. If you pass a blood clot from your vagina, save it to show to your health care provider. Do not flush blood clots down the toilet without having your health care provider look at them.  You urinate more than usual.  You are dizzy or light-headed.  You have not breastfed at all and you have not had a menstrual period for 12 weeks after delivery.  You have stopped breastfeeding and you have not had a menstrual period for 12 weeks after you stopped breastfeeding. Get help right away if:  You have: ? Pain that does not go away or does not get better with medicine. ? Chest pain. ? Difficulty breathing. ? Blurred vision or spots in your vision. ? Thoughts about hurting yourself or your baby.  You develop pain in your abdomen or in one of your legs.  You develop a severe headache.  You faint.  You bleed from your vagina so much that you fill two sanitary pads in one hour. This information is not intended to replace advice given to you by your health care provider. Make sure you discuss any questions you have with your health care provider. Document Released: 05/19/2000 Document Revised: 11/03/2015 Document Reviewed: 06/06/2015 Elsevier Interactive Patient Education  2019 [employer].

## 2018-08-28 NOTE — Lactation Note (Signed)
This note was copied from a baby's chart. Lactation Consultation Note  Patient Name: Valerie Clark SEGBT'D Date: 08/28/2018 Reason for consult: Initial assessment;Term  P2 mother whose infant is now 27 hours old.  Mother breast fed her first child.    Mother had no questions/concerns related to breast feeding.  She stated that she is doing "fine" and baby is feeding well.  She does not wish for me to assess a feeding.  Mother has also been supplementing with formula due to her concern that baby is "not getting enough."  She informed me that lactation has seen her and she knows a lot about breast feeding from her first child.  Mother stated she has no concerns about her breasts/nipples.  She is familiar with engorgement.  Offered a manual pump but mother declined informing me she has one at home.  Mother has our OP phone number to call for any concerns after discharge.  Father present.   Maternal Data Formula Feeding for Exclusion: No Has patient been taught Hand Expression?: Yes Does the patient have breastfeeding experience prior to this delivery?: Yes  Feeding Feeding Type: Breast Fed  LATCH Score                   Interventions    Lactation Tools Discussed/Used     Consult Status Consult Status: Complete    Valerie Clark 08/28/2018, 8:19 AM

## 2018-09-23 ENCOUNTER — Ambulatory Visit (INDEPENDENT_AMBULATORY_CARE_PROVIDER_SITE_OTHER): Payer: BLUE CROSS/BLUE SHIELD | Admitting: Obstetrics and Gynecology

## 2018-09-23 ENCOUNTER — Other Ambulatory Visit: Payer: Self-pay

## 2018-09-23 ENCOUNTER — Encounter: Payer: Self-pay | Admitting: Obstetrics and Gynecology

## 2018-09-23 NOTE — Progress Notes (Signed)
TELEHEALTH VIRTUAL POSTPARTUM VISIT ENCOUNTER NOTE  I connected with Valerie Clark on 09/23/18 at  3:30 PM EDT by telephone at home and verified that I am speaking with the correct person using two identifiers.   I discussed the limitations, risks, security and privacy concerns of performing an evaluation and management service by telephone and the availability of in person appointments. I also discussed with the patient that there may be a patient responsible charge related to this service. The patient expressed understanding and agreed to proceed.  Appointment Date: 09/23/2018  OBGYN Clinic: [facility]  Chief Complaint:  Chief Complaint  Patient presents with  . Postpartum Care    History of Present Illness: Valerie Clark is a 28 y.o. [phone], seen for the above chief complaint. Her past medical history is significant for GDMA2   She is s/p normal spontaneous vaginal delivery on 08/26/18 at 39 weeks; she was discharged to home on 08/28/18. Pregnancy complicated by GDMA2. Baby is doing well. Patient is doing well. She reports receiving ample help from her husband. She is breast/bottle feeding, more breastmilk than formula. She has not been sexually active and is interested in HuronParaguard for contraception.  Vaginal bleeding or discharge: No  Mode of feeding infant: both Intercourse: No  Contraception: no method PP depression s/s: No .  Any bowel or bladder issues: No  Pap smear: no abnormalities (date: 02/06/18)  Review of Systems: Her 12 point review of systems is negative or as noted in the History of Present Illness.  Patient Active Problem List   Diagnosis Date Noted  . Supervision of high risk pregnancy, antepartum 08/26/2018  . Gestational diabetes mellitus (GDM) controlled on oral hypoglycemic drug 06/10/2018  . Supervision of high-risk pregnancy 02/06/2018  . Low serum vitamin D 05/12/2016  . History of IUFD 05/08/2016    Medications Valerie Clark had no medications  administered during this visit. Current Outpatient Medications  Medication Sig Dispense Refill  . Prenat w/o A Vit-FeFum-FePo-FA (CONCEPT OB) 130-92.4-1 MG CAPS Take 1 capsule by mouth daily. 30 capsule 11  . ibuprofen (ADVIL,MOTRIN) 600 MG tablet Take 1 tablet (600 mg total) by mouth every 6 (six) hours. (Patient not taking: Reported on 09/23/2018) 30 tablet 0   No current facility-administered medications for this visit.     Allergies Patient has no known allergies.  Physical Exam:  General:  Alert, oriented and cooperative.   Mental Status: Normal mood and affect perceived. Normal judgment and thought content.  Rest of physical exam deferred due to type of encounter  PP Depression Screening:   Edinburgh Postnatal Depression Scale - 09/23/18 1439      Edinburgh Postnatal Depression Scale:  In the Past 7 Days   I have been able to laugh and see the funny side of things.  0    I have looked forward with enjoyment to things.  0    I have blamed myself unnecessarily when things went wrong.  0    I have been anxious or worried for no good reason.  0    I have felt scared or panicky for no good reason.  0    Things have been getting on top of me.  0    I have been so unhappy that I have had difficulty sleeping.  0    I have felt sad or miserable.  0    I have been so unhappy that I have been crying.  0    The thought of harming  myself has occurred to me.  0    Edinburgh Postnatal Depression Scale Total  0       Assessment:Patient is a 28 y.o. [id number] who is 4 weeks postpartum from a normal spontaneous vaginal delivery.  She is doing well.   Plan: Patient will return in 2 weeks for IUD insertion and 2 hour postpartum glucola Patient plans in using condoms in the meantime There are no diagnoses linked to this encounter.   I discussed the assessment and treatment plan with the patient. The patient was provided an opportunity to ask questions and all were answered. The patient  agreed with the plan and demonstrated an understanding of the instructions.   The patient was advised to call back or seek an in-person evaluation/go to the ED for any concerning postpartum symptoms.  I provided 15 minutes of non-face-to-face time during this encounter.   Catalina Antigua, MD Center for [employer], [facility]

## 2018-10-07 ENCOUNTER — Ambulatory Visit: Payer: BLUE CROSS/BLUE SHIELD | Admitting: Obstetrics and Gynecology

## 2018-10-07 ENCOUNTER — Other Ambulatory Visit: Payer: BLUE CROSS/BLUE SHIELD

## 2018-10-07 ENCOUNTER — Encounter: Payer: Self-pay | Admitting: Obstetrics and Gynecology

## 2018-10-07 ENCOUNTER — Other Ambulatory Visit: Payer: Self-pay

## 2018-10-07 DIAGNOSIS — O24415 Gestational diabetes mellitus in pregnancy, controlled by oral hypoglycemic drugs: Secondary | ICD-10-CM

## 2018-10-07 DIAGNOSIS — Z30011 Encounter for initial prescription of contraceptive pills: Secondary | ICD-10-CM

## 2018-10-07 MED ORDER — NORETHINDRONE 0.35 MG PO TABS: 1.0000 | ORAL_TABLET | Freq: Every day | ORAL | 11 refills | Status: DC

## 2018-10-07 NOTE — Progress Notes (Signed)
Patient initially scheduled for IIUD insertion but opted for progesterone only pills. Rx sent to her pharmacy Patient is doing postpartum glucola challenge test due to GDM

## 2018-10-08 LAB — GLUCOSE TOLERANCE, 2 HOURS
Glucose, 2 hour: 101 mg/dL (ref 65–139)
Glucose, GTT - Fasting: 85 mg/dL (ref 65–99)

## 2019-09-02 IMAGING — US US MFM OB FOLLOW-UP
2 series · 13 of 28 positions shown · non-contrast
Comparison: none

[Series 1: us mfm ob follow-up · 5 of 24 slices shown (1 of 2)]
[im 3/24]
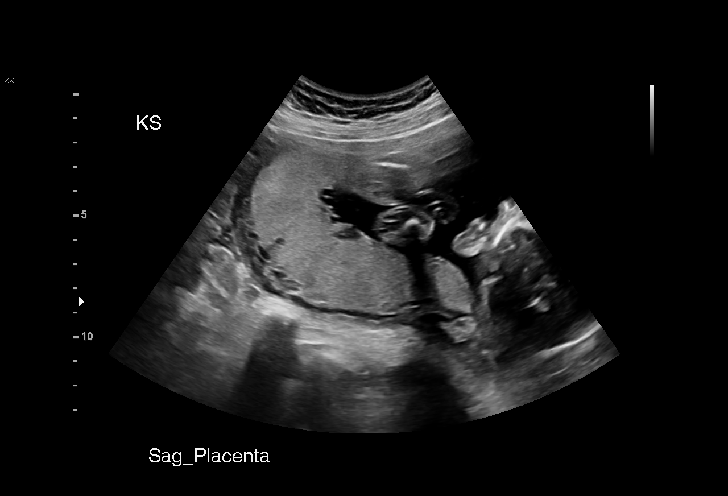
[im 8/24]
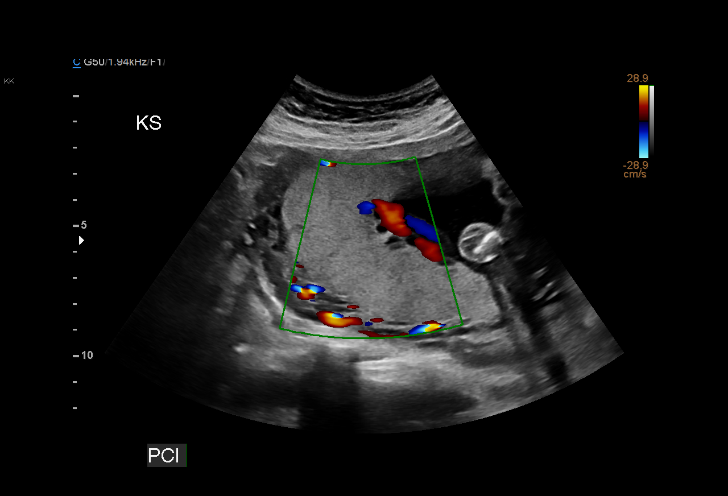
[im 13/24]
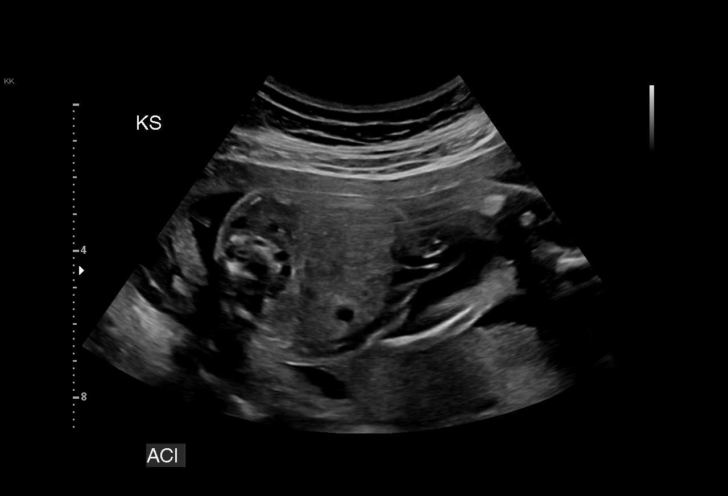
[im 18/24]
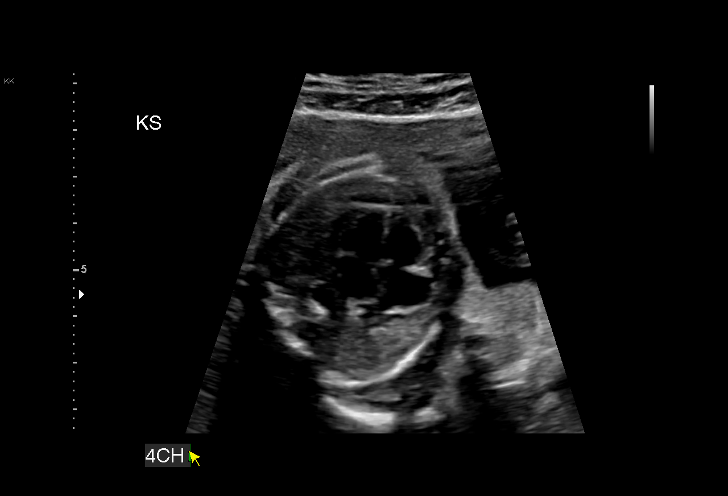
[im 24/24]
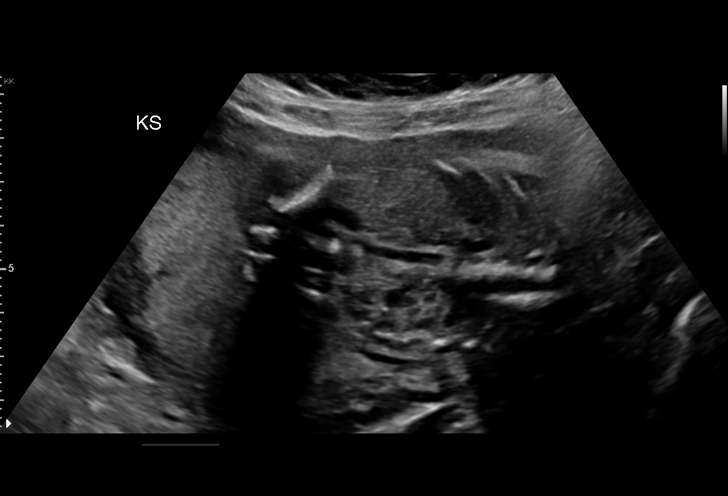

[Series 2: us mfm ob follow-up · 8 of 42 slices shown (2 of 2)]
[im 3/42]
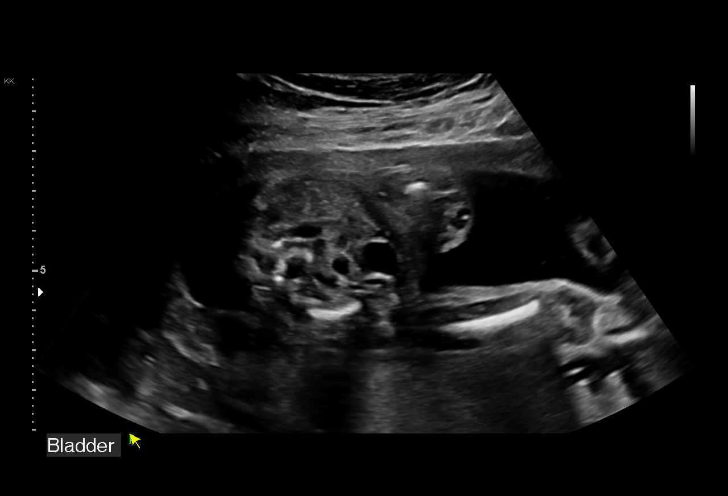
[im 10/42]
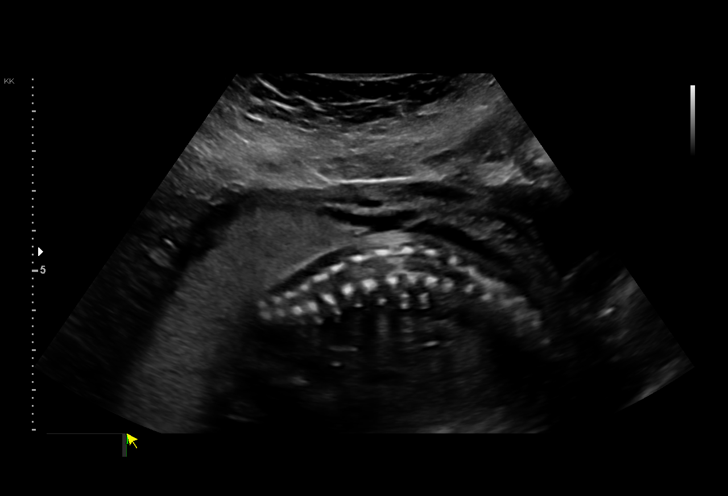
[im 15/42]
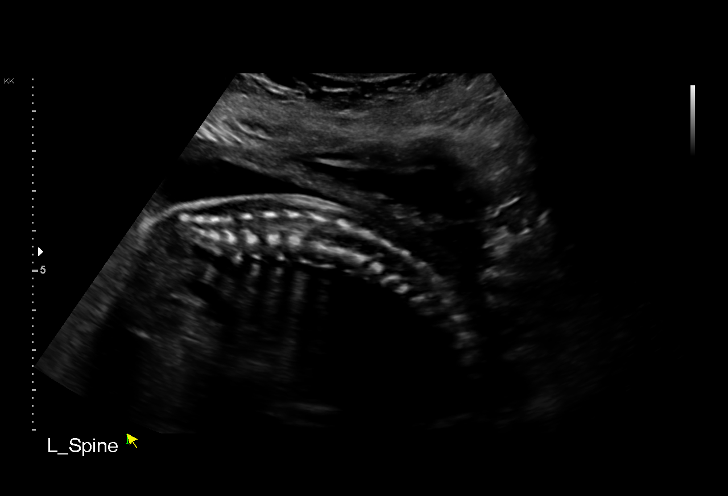
[im 20/42]
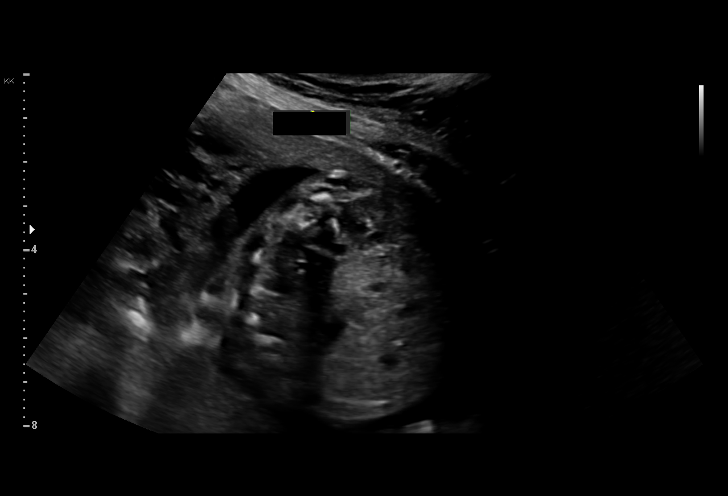
[im 25/42]
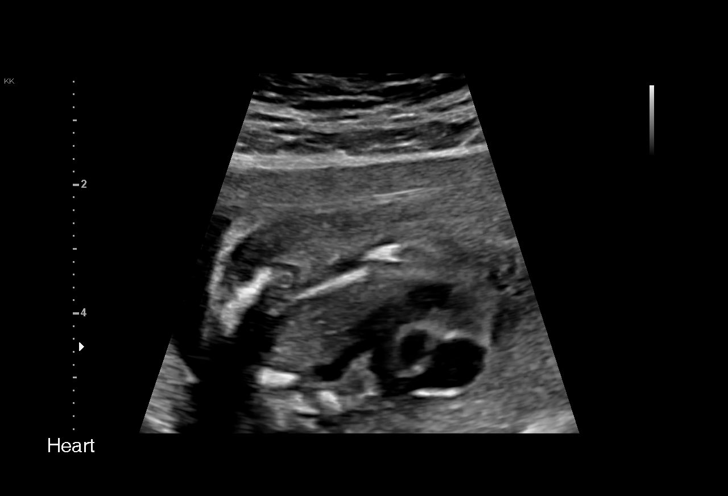
[im 29/42]
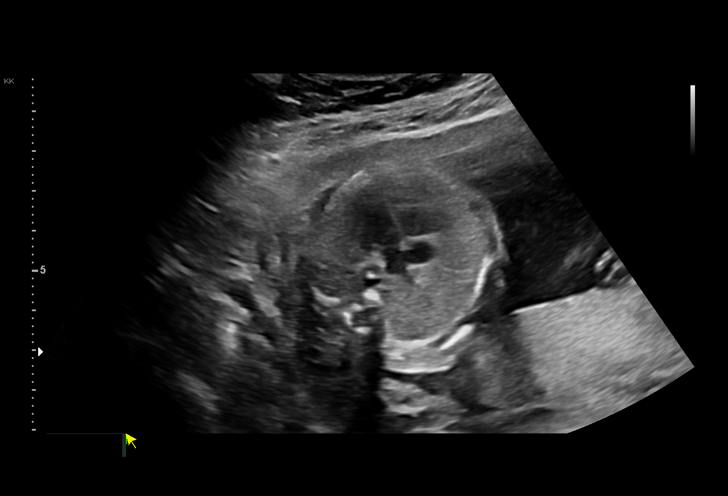
[im 34/42]
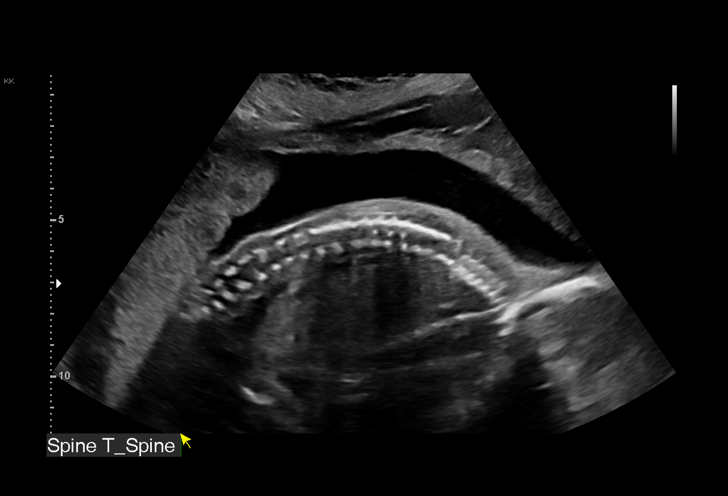
[im 39/42]
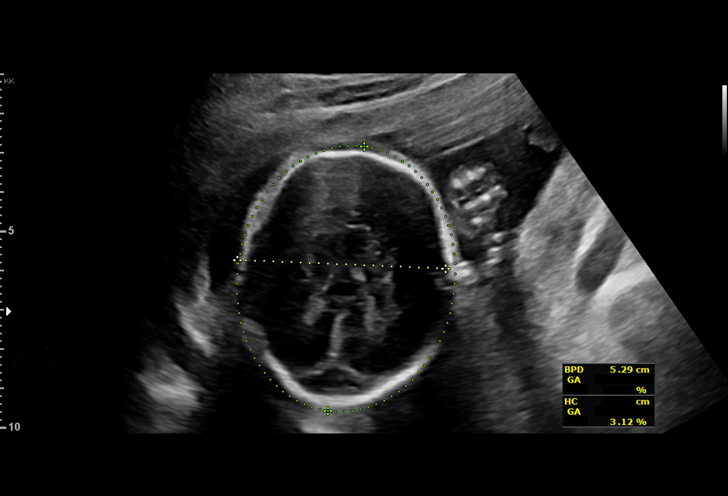

[13 of 28 positions shown; findings below may reference images not displayed]

[REDACTED]care - [HOSPITAL]

 ----------------------------------------------------------------------

 ----------------------------------------------------------------------
Indications

  Encounter for other antenatal screening
  follow-up
  Poor obstetric history: Previous IUFD (23
  weeks) - low risk Panorama
  23 weeks gestation of pregnancy
 ----------------------------------------------------------------------
Vital Signs

 BMI:
Fetal Evaluation

 Num Of Fetuses:          1
 Fetal Heart Rate(bpm):   142
 Cardiac Activity:        Observed
 Presentation:            Cephalic
 Placenta:                Posterior
 P. Cord Insertion:       Previously Visualized

 Amniotic Fluid
 AFI FV:      Within normal limits

                             Largest Pocket(cm)

Biometry

 BPD:      54.1  mm     G. Age:  22w 3d         25  %    CI:        74.14   %    70 - 86
                                                         FL/HC:       18.7  %    19.2 -
 HC:      199.5  mm     G. Age:  22w 1d          9  %    HC/AC:       1.11       1.05 -
 AC:      180.4  mm     G. Age:  22w 6d         39  %    FL/BPD:      68.9  %    71 - 87
 FL:       37.3  mm     G. Age:  21w 6d         10  %    FL/AC:       20.7  %    20 - 24

 Est. FW:     502   gm     1 lb 2 oz     40  %
OB History

 Gravidity:    5         Term:   1        Prem:   1        SAB:   2
 TOP:          0       Ectopic:  0        Living: 1
Gestational Age

 LMP:           23w 0d        Date:  11/26/17                 EDD:   09/02/18
 U/S Today:     22w 2d                                        EDD:   09/07/18
 Best:          23w 0d     Det. By:  LMP  (11/26/17)          EDD:   09/02/18
Anatomy

 Cranium:               Appears normal         LVOT:                   Appears normal
 Cavum:                 Previously seen        Aortic Arch:            Previously seen
 Ventricles:            Previously seen        Ductal Arch:            Previously seen
 Choroid Plexus:        Previously seen        Diaphragm:              Appears normal
 Cerebellum:            Previously seen        Stomach:                Appears normal, left
                                                                       sided
 Posterior Fossa:       Previously seen        Abdomen:                Appears normal
 Nuchal Fold:           Previously seen        Abdominal Wall:         Appears nml (cord
                                                                       insert, abd wall)
 Face:                  Orbits and profile     Cord Vessels:           Previously seen
                        previously seen
 Lips:                  Previously seen        Kidneys:                Appear normal
 Palate:                Not well visualized    Bladder:                Appears normal
 Thoracic:              Appears normal         Spine:                  Appears normal
 Heart:                 Appears normal         Upper Extremities:      Previously seen
                        (4CH, axis, and
                        situs)
 RVOT:                  Appears normal         Lower Extremities:      Previously seen

 Other:  Heels and Nasal bone previously visualized.
Cervix Uterus Adnexa

 Cervix
 Length:            3.5  cm.
 Normal appearance by transabdominal scan.
Impression

 Normal interval growth.  No ultrasonic evidence of structural
 fetal anomalies.
Recommendations

 Follow up as clinically indicated.

## 2019-12-03 ENCOUNTER — Inpatient Hospital Stay (HOSPITAL_COMMUNITY)
Admission: AD | Admit: 2019-12-03 | Discharge: 2019-12-03 | Disposition: A | Discharge status: Home / Self Care | Payer: BLUE CROSS/BLUE SHIELD | Attending: Obstetrics and Gynecology | Admitting: Obstetrics and Gynecology

## 2019-12-03 ENCOUNTER — Other Ambulatory Visit: Payer: Self-pay

## 2019-12-03 ENCOUNTER — Encounter (HOSPITAL_COMMUNITY): Payer: Self-pay | Admitting: Obstetrics and Gynecology

## 2019-12-03 DIAGNOSIS — Z791 Long term (current) use of non-steroidal anti-inflammatories (NSAID): Secondary | ICD-10-CM | POA: Insufficient documentation

## 2019-12-03 DIAGNOSIS — Z79899 Other long term (current) drug therapy: Secondary | ICD-10-CM | POA: Diagnosis not present

## 2019-12-03 DIAGNOSIS — K219 Gastro-esophageal reflux disease without esophagitis: Secondary | ICD-10-CM | POA: Insufficient documentation

## 2019-12-03 DIAGNOSIS — O24111 Pre-existing diabetes mellitus, type 2, in pregnancy, first trimester: Secondary | ICD-10-CM | POA: Diagnosis not present

## 2019-12-03 DIAGNOSIS — Z3A01 Less than 8 weeks gestation of pregnancy: Secondary | ICD-10-CM | POA: Diagnosis not present

## 2019-12-03 DIAGNOSIS — O99611 Diseases of the digestive system complicating pregnancy, first trimester: Secondary | ICD-10-CM | POA: Insufficient documentation

## 2019-12-03 DIAGNOSIS — O210 Mild hyperemesis gravidarum: Secondary | ICD-10-CM | POA: Diagnosis present

## 2019-12-03 DIAGNOSIS — O219 Vomiting of pregnancy, unspecified: Secondary | ICD-10-CM

## 2019-12-03 LAB — URINALYSIS, ROUTINE W REFLEX MICROSCOPIC
Bilirubin Urine: NEGATIVE
Glucose, UA: NEGATIVE mg/dL
Hgb urine dipstick: NEGATIVE
Ketones, ur: NEGATIVE mg/dL
Leukocytes,Ua: NEGATIVE
Nitrite: NEGATIVE
Protein, ur: NEGATIVE mg/dL
Specific Gravity, Urine: 1.009 (ref 1.005–1.030)
pH: 7 (ref 5.0–8.0)

## 2019-12-03 LAB — POCT PREGNANCY, URINE: Preg Test, Ur: POSITIVE — AB

## 2019-12-03 MED ORDER — DOXYLAMINE-PYRIDOXINE 10-10 MG PO TBEC: 2.0000 | DELAYED_RELEASE_TABLET | Freq: Every day | ORAL | 1 refills | Status: DC

## 2019-12-03 MED ORDER — FAMOTIDINE 20 MG PO TABS
20.0000 mg | ORAL_TABLET | Freq: Every day | ORAL | 1 refills | Status: DC
Start: 2019-12-03 — End: 2022-04-19

## 2019-12-03 MED ORDER — SCOPOLAMINE 1 MG/3DAYS TD PT72
1.0000 | MEDICATED_PATCH | TRANSDERMAL | Status: DC
  Administered 2019-12-03: 1.5 mg via TRANSDERMAL
  Filled 2019-12-03: qty 1

## 2019-12-03 MED ORDER — SCOPOLAMINE 1 MG/3DAYS TD PT72: 1.0000 | MEDICATED_PATCH | TRANSDERMAL | 1 refills | Status: DC

## 2019-12-03 NOTE — Discharge Instructions (Signed)
Morning Sickness  Morning sickness is when you feel sick to your stomach (nauseous) during pregnancy. You may feel sick to your stomach and throw up (vomit). You may feel sick in the morning, but you can feel this way at any time of day. Some women feel very sick to their stomach and cannot stop throwing up (hyperemesis gravidarum). Follow these instructions at home: Medicines  Take over-the-counter and prescription medicines only as told by your doctor. Do not take any medicines until you talk with your doctor about them first.  Taking multivitamins before getting pregnant can stop or lessen the harshness of morning sickness. Eating and drinking  Eat dry toast or crackers before getting out of bed.  Eat 5 or 6 small meals a day.  Eat dry and bland foods like rice and baked potatoes.  Do not eat greasy, fatty, or spicy foods.  Have someone cook for you if the smell of food causes you to feel sick or throw up.  If you feel sick to your stomach after taking prenatal vitamins, take them at night or with a snack.  Eat protein when you need a snack. Nuts, yogurt, and cheese are good choices.  Drink fluids throughout the day.  Try ginger ale made with real ginger, ginger tea made from fresh grated ginger, or ginger candies. General instructions  Do not use any products that have nicotine or tobacco in them, such as cigarettes and e-cigarettes. If you need help quitting, ask your doctor.  Use an air purifier to keep the air in your house free of smells.  Get lots of fresh air.  Try to avoid smells that make you feel sick.  Try: ? Wearing a bracelet that is used for seasickness (acupressure wristband). ? Going to a doctor who puts thin needles into certain body points (acupuncture) to improve how you feel. Contact a doctor if:  You need medicine to feel better.  You feel dizzy or light-headed.  You are losing weight. Get help right away if:  You feel very sick to your  stomach and cannot stop throwing up.  You pass out (faint).  You have very bad pain in your belly. Summary  Morning sickness is when you feel sick to your stomach (nauseous) during pregnancy.  You may feel sick in the morning, but you can feel this way at any time of day.  Making some changes to what you eat may help your symptoms go away. This information is not intended to replace advice given to you by your health care provider. Make sure you discuss any questions you have with your health care provider. Document Revised: 05/04/2017 Document Reviewed: 06/22/2016 Elsevier Patient Education  2020 Else[employer] Inc.   Heartburn During Pregnancy  Heartburn is pain or discomfort in the throat or chest. It may cause a burning feeling. It happens when stomach acid moves up into the tube that carries food from your mouth to your stomach (esophagus). Heartburn is common during pregnancy. It usually goes away or gets better after giving birth. Follow these instructions at home: Eating and drinking  Do not drink alcohol while you are pregnant.  Figure out which foods and beverages make you feel worse, and avoid them.  Beverages that you may want to avoid include: ? Coffee and tea (with or without caffeine). ? Energy drinks and sports drinks. ? Bubbly (carbonated) drinks or sodas. ? Citrus fruit juices.  Foods that you may want to avoid include: ? Chocolate and cocoa. ? Peppermint  and mint flavorings. ? Garlic, onions, and horseradish. ? Spicy and acidic foods. These include peppers, chili powder, curry powder, vinegar, hot sauces, and barbecue sauce. ? Citrus fruits, such as oranges, lemons, and limes. ? Tomato-based foods, such as red sauce, chili, and salsa. ? Fried and fatty foods, such as donuts, french fries, potato chips, and high-fat dressings. ? High-fat meats, such as hot dogs, cold cuts, sausage, ham, and bacon. ? High-fat dairy items, such as whole milk, butter, and  cheese.  Eat small meals often, instead of large meals.  Avoid drinking a lot of liquid with your meals.  Avoid eating meals during the 2-3 hours before you go to bed.  Avoid lying down right after you eat.  Do not exercise right after you eat. Medicines  Take over-the-counter and prescription medicines only as told by your doctor.  Do not take aspirin, ibuprofen, or other NSAIDs unless your doctor tells you to do that.  Your doctor may tell you to avoid medicines that have sodium bicarbonate in them. General instructions   If told, raise the head of your bed about 6 inches (15 cm). You can do this by putting blocks under the legs. Sleeping with more pillows does not help with heartburn.  Do not use any products that contain nicotine or tobacco, such as cigarettes and e-cigarettes. If you need help quitting, ask your doctor.  Wear loose-fitting clothing.  Try to lower your stress, such as with yoga or meditation. If you need help, ask your doctor.  Stay at a healthy weight. If you are overweight, work with your doctor to safely lose weight.  Keep all follow-up visits as told by your doctor. This is important. Contact a doctor if:  You get new symptoms.  Your symptoms do not get better with treatment.  You have weight loss and you do not know why.  You have trouble swallowing.  You make loud sounds when you breathe (wheeze).  You have a cough that does not go away.  You have heartburn often for more than 2 weeks.  You feel sick to your stomach (nauseous), and this does not get better with treatment.  You are throwing up (vomiting), and this does not get better with treatment.  You have pain in your belly (abdomen). Get help right away if:  You have very bad chest pain that spreads to your arm, neck, or jaw.  You feel sweaty, dizzy, or light-headed.  You have trouble breathing.  You have pain when swallowing.  You throw up and your throw-up looks like  blood or coffee grounds.  Your poop (stool) is bloody or black. This information is not intended to replace advice given to you by your health care provider. Make sure you discuss any questions you have with your health care provider. Document Revised: 09/12/2018 Document Reviewed: 02/07/2016 Elsevier Patient Education  2020 [employer].

## 2019-12-03 NOTE — MAU Provider Note (Signed)
History     CSN: [id number]  Arrival date and time: 12/03/19 1232   First Provider Initiated Contact with Patient 12/03/19 1334      Chief Complaint  Patient presents with  . Possible Pregnancy  . Emesis  . Dizziness   29 y.o. [id number] @[redacted]w[redacted]d  presenting with N/V. Reports nausea, fatigue, and weakness over the last week. Had one episode of emesis last night and another episode today. Also reports burning in upper abdomen and throat. Feels like food is not going down. Denies weight loss. She has not tried anything to alleviate sx. Reports a few episodes of low temp of 99. No sick contacts. Denies abdominal pain and VB. She reports taking Diclegis in a previous pregnancy which helped.   OB History    Gravida  5   Para  3   Term  2   Preterm  1   AB  1   Living  2     SAB  1   TAB      Ectopic      Multiple  0   Live Births  2           Past Medical History:  Diagnosis Date  . Diabetes mellitus without complication (HCC)   . Medical history non-contributory     Past Surgical History:  Procedure Laterality Date  . NO PAST SURGERIES      Family History  Family history unknown: Yes    Social History   Tobacco Use  . Smoking status: Never Smoker  . Smokeless tobacco: Never Used  Vaping Use  . Vaping Use: Never used  Substance Use Topics  . Alcohol use: No  . Drug use: No    Allergies: No Known Allergies  No medications prior to admission.    Review of Systems  Constitutional: Positive for fatigue. Negative for chills, fever and unexpected weight change.  Gastrointestinal: Positive for nausea and vomiting. Negative for abdominal pain, constipation and diarrhea.  Genitourinary: Negative for dysuria, frequency, urgency, vaginal bleeding and vaginal discharge.  Neurological: Positive for weakness.   Physical Exam   Blood pressure 125/72, pulse 93, temperature 97.8 F (36.6 C), resp. rate 16, height 5\' 4"  (1.626 m), weight 67.6 kg, last  menstrual period 10/19/2019, SpO2 100 %, currently breastfeeding. Orthostatic VS for the past 24 hrs:  BP- Lying Pulse- Lying BP- Sitting Pulse- Sitting BP- Standing at 0 minutes Pulse- Standing at 0 minutes  12/03/19 1408 -- -- -- -- 125/72 93  12/03/19 1405 113/69 85 116/65 90 -- --   Physical Exam Vitals and nursing note reviewed. Exam conducted with a chaperone present.  Constitutional:      General: She is not in acute distress.    Appearance: Normal appearance.  HENT:     Head: Normocephalic.  Pulmonary:     Effort: Pulmonary effort is normal. No respiratory distress.  Abdominal:     General: There is no distension.     Palpations: Abdomen is soft.     Tenderness: There is no abdominal tenderness. There is no guarding or rebound.  Musculoskeletal:        General: Normal range of motion.  Skin:    General: Skin is warm and dry.  Neurological:     General: No focal deficit present.     Mental Status: She is alert and oriented to person, place, and time.  Psychiatric:        Mood and Affect: Mood normal.    Results for  orders placed or performed during the hospital encounter of 12/03/19 (from the past 24 hour(s))  Pregnancy, urine POC     Status: Abnormal   Collection Time: 12/03/19 12:54 PM  Result Value Ref Range   Preg Test, Ur POSITIVE (A) NEGATIVE  Urinalysis, Routine w reflex microscopic     Status: None   Collection Time: 12/03/19 12:55 PM  Result Value Ref Range   Color, Urine YELLOW YELLOW   APPearance CLEAR CLEAR   Specific Gravity, Urine 1.009 1.005 - 1.030   pH 7.0 5.0 - 8.0   Glucose, UA NEGATIVE NEGATIVE mg/dL   Hgb urine dipstick NEGATIVE NEGATIVE   Bilirubin Urine NEGATIVE NEGATIVE   Ketones, ur NEGATIVE NEGATIVE mg/dL   Protein, ur NEGATIVE NEGATIVE mg/dL   Nitrite NEGATIVE NEGATIVE   Leukocytes,Ua NEGATIVE NEGATIVE   MAU Course  Procedures  MDM Labs ordered and reviewed. Not orthostatic. No evidence of dehydration. Denies nausea currently,  feels hungry. Scop patch applied. Will Rx Diclegis and Pepcid in addition. Stable for discharge home.  Assessment and Plan   1. [redacted] weeks gestation of pregnancy   2. Morning sickness   3. Gastroesophageal reflux disease without esophagitis    Discharge home Follow up at [facility] as scheduled Rx Diclegis Rx Pepcid Rx Scopolamine Return precautions Maintain hydration  Allergies as of 12/03/2019   No Known Allergies     Medication List    STOP taking these medications   ibuprofen 600 MG tablet Commonly known as: ADVIL   norethindrone 0.35 MG tablet Commonly known as: MICRONOR     TAKE these medications   Concept OB 130-92.4-1 MG Caps Take 1 capsule by mouth daily.   Doxylamine-Pyridoxine 10-10 MG Tbec Take 2 tablets by mouth at bedtime. May also take 1 tab in am and 1 tab in pm   famotidine 20 MG tablet Commonly known as: PEPCID Take 1 tablet (20 mg total) by mouth at bedtime.   scopolamine 1 MG/3DAYS Commonly known as: TRANSDERM-SCOP Place 1 patch (1.5 mg total) onto the skin every 3 (three) days. Start taking on: December 06, 2019      Donette Larry, [place] 12/03/2019, 2:32 PM

## 2019-12-03 NOTE — MAU Note (Addendum)
.   Valerie Clark is a 29 y.o. at [redacted]w[redacted]d here in MAU reporting: N/V with reflux. Reports dizziness since yesterday. Denies any pain or VB at present. Pt states she has had mild lower abdominal cramping a couple of weeks ago LMP: 10/19/19 Onset of complaint: a week Pain score: 0 Vitals:   12/03/19 1256  BP: 120/78  Pulse: 89  Resp: 16  Temp: 97.8 F (36.6 C)  SpO2: 100%     FHT: Lab orders placed from triage: UA/UPT

## 2019-12-04 DIAGNOSIS — Z419 Encounter for procedure for purposes other than remedying health state, unspecified: Secondary | ICD-10-CM | POA: Diagnosis not present

## 2019-12-30 ENCOUNTER — Ambulatory Visit (INDEPENDENT_AMBULATORY_CARE_PROVIDER_SITE_OTHER): Payer: BLUE CROSS/BLUE SHIELD

## 2019-12-30 ENCOUNTER — Ambulatory Visit: Payer: BLUE CROSS/BLUE SHIELD

## 2019-12-30 ENCOUNTER — Other Ambulatory Visit: Payer: Self-pay

## 2019-12-30 VITALS — BP 118/76 | HR 91 | Ht 64.0 in | Wt 148.1 lb

## 2019-12-30 DIAGNOSIS — Z3687 Encounter for antenatal screening for uncertain dates: Secondary | ICD-10-CM

## 2019-12-30 DIAGNOSIS — O219 Vomiting of pregnancy, unspecified: Secondary | ICD-10-CM

## 2019-12-30 DIAGNOSIS — O3680X0 Pregnancy with inconclusive fetal viability, not applicable or unspecified: Secondary | ICD-10-CM

## 2019-12-30 DIAGNOSIS — Z3491 Encounter for supervision of normal pregnancy, unspecified, first trimester: Secondary | ICD-10-CM | POA: Insufficient documentation

## 2019-12-30 DIAGNOSIS — Z3481 Encounter for supervision of other normal pregnancy, first trimester: Secondary | ICD-10-CM

## 2019-12-30 DIAGNOSIS — Z3480 Encounter for supervision of other normal pregnancy, unspecified trimester: Secondary | ICD-10-CM

## 2019-12-30 MED ORDER — CONCEPT OB 130-92.4-1 MG PO CAPS: 1.0000 | ORAL_CAPSULE | Freq: Every day | ORAL | 11 refills | Status: DC

## 2019-12-30 NOTE — Progress Notes (Signed)
.   PRENATAL INTAKE SUMMARY  [name] presents today New OB Nurse Interview.  OB History    Gravida  5   Para  3   Term  2   Preterm  1   AB  1   Living  2     SAB  1   TAB      Ectopic      Multiple  0   Live Births  2          I have reviewed the patient's medical, obstetrical, social, and family histories, medications, and available lab results.  SUBJECTIVE She has no unusual complaints  OBJECTIVE Initial Physical Exam (New OB)  GENERAL APPEARANCE: alert, well appearing   ASSESSMENT Normal pregnancy  PLAN Prenatal care Labs will be completed at [facility] OB visit Patient has BP monitor Baby Scripts ordered. Prenatal Vitamins sent to pharmacy  U/S today reveals [redacted]w[redacted]d IUP. FHR 168

## 2020-01-04 DIAGNOSIS — Z419 Encounter for procedure for purposes other than remedying health state, unspecified: Secondary | ICD-10-CM | POA: Diagnosis not present

## 2020-01-06 ENCOUNTER — Encounter: Payer: Self-pay | Admitting: Obstetrics and Gynecology

## 2020-01-06 ENCOUNTER — Ambulatory Visit (INDEPENDENT_AMBULATORY_CARE_PROVIDER_SITE_OTHER): Payer: BLUE CROSS/BLUE SHIELD | Admitting: Obstetrics and Gynecology

## 2020-01-06 ENCOUNTER — Inpatient Hospital Stay (HOSPITAL_COMMUNITY): Admit: 2020-01-06 | Payer: BLUE CROSS/BLUE SHIELD

## 2020-01-06 ENCOUNTER — Other Ambulatory Visit: Payer: Self-pay

## 2020-01-06 VITALS — BP 106/69 | HR 92 | Wt 147.9 lb

## 2020-01-06 DIAGNOSIS — Z8759 Personal history of other complications of pregnancy, childbirth and the puerperium: Secondary | ICD-10-CM

## 2020-01-06 DIAGNOSIS — O0991 Supervision of high risk pregnancy, unspecified, first trimester: Secondary | ICD-10-CM | POA: Diagnosis not present

## 2020-01-06 DIAGNOSIS — Z3481 Encounter for supervision of other normal pregnancy, first trimester: Secondary | ICD-10-CM

## 2020-01-06 DIAGNOSIS — O09299 Supervision of pregnancy with other poor reproductive or obstetric history, unspecified trimester: Secondary | ICD-10-CM | POA: Diagnosis not present

## 2020-01-06 DIAGNOSIS — O0990 Supervision of high risk pregnancy, unspecified, unspecified trimester: Secondary | ICD-10-CM | POA: Diagnosis not present

## 2020-01-06 DIAGNOSIS — O09291 Supervision of pregnancy with other poor reproductive or obstetric history, first trimester: Secondary | ICD-10-CM

## 2020-01-06 DIAGNOSIS — K219 Gastro-esophageal reflux disease without esophagitis: Secondary | ICD-10-CM

## 2020-01-06 DIAGNOSIS — O219 Vomiting of pregnancy, unspecified: Secondary | ICD-10-CM | POA: Diagnosis not present

## 2020-01-06 DIAGNOSIS — Z3A11 11 weeks gestation of pregnancy: Secondary | ICD-10-CM

## 2020-01-06 DIAGNOSIS — Z8632 Personal history of gestational diabetes: Secondary | ICD-10-CM

## 2020-01-06 MED ORDER — DOXYLAMINE-PYRIDOXINE 10-10 MG PO TBEC: 2.0000 | DELAYED_RELEASE_TABLET | Freq: Every day | ORAL | 1 refills | Status: DC

## 2020-01-06 NOTE — Patient Instructions (Signed)

## 2020-01-06 NOTE — Progress Notes (Signed)
PRENATAL VISIT NOTE  Subjective:  Valerie Clark is a 29 y.o. [id number] at [redacted]w[redacted]d being seen today for ongoing prenatal care.  She is currently monitored for the following issues for this high-risk pregnancy and has History of IUFD; Low serum vitamin D; Supervision of high-risk pregnancy; History of gestational diabetes mellitus (GDM) in prior pregnancy, currently pregnant in first trimester; Supervision of high risk pregnancy, antepartum; Encounter for supervision of normal pregnancy, unspecified, first trimester; and Nausea and vomiting in pregnancy prior to [redacted] weeks gestation on their problem list.  Pt reports today that her prior IUFD was recognized by absence of heart tones in the setting of decreased fetal movement. She was then induced without complication. Given her history of A2GDM (well controlled on MTF), she has been checking her BG levels at home (FBG 80s, 1hr PP 90s). She is not working and reports daily physical activity. She is very motivated to maintain a healthy weight for this pregnancy. She has received the COVID vaccine. She plans to breast and bottle feed. She is unsure of birth control but considering IUD vs POPs.  Patient reports nausea and vomiting once daily. She continues to have good po intake and requests a refill of diclegis which she is currently taking with good benefit.  Contractions: Not present. Vag. Bleeding: None.   . Denies leaking of fluid.   The following portions of the patient's history were reviewed and updated as appropriate: allergies, current medications, past family history, past medical history, past social history, past surgical history and problem list.   Objective:   Vitals:   01/06/20 1348  BP: 106/69  Pulse: 92  Weight: 147 lb 14.4 oz (67.1 kg)    Fetal Status: Fetal Heart Rate (bpm): 169         General:  Alert, oriented and cooperative. Patient is in no acute distress.  Skin: Skin is warm and dry. No rash noted.   Cardiovascular: Normal  heart rate noted  Respiratory: Normal respiratory effort, no problems with respiration noted  Abdomen: Soft, gravid, appropriate for gestational age.  Pain/Pressure: Absent     Pelvic: Cervical exam deferred        Extremities: Normal range of motion.  Edema: None  Mental Status: Normal mood and affect. Normal behavior. Normal judgment and thought content.   Assessment and Plan:  Pregnancy: [id number] at [redacted]w[redacted]d 1. Encounter for supervision of other normal pregnancy in first trimester: Dating based on exact LMP with h/o regular periods. - initial prenatal order set obtained today - anatomy scan ordered today and message sent to schedule appt - plan for f/u appt in 4 weeks or sooner as indicated; return precautions provided  2. History of A2GDM in prior pregnancy: As noted pt with history of A2GDM on MTF in prior pregnancy with postpartum glucola within normal limits. - A1c, CMP, TSH obtained today  3. History of IUFD, second trimester: Reviewed history of IUFD first recognized in the setting of decreased fetal movement and found to have absent heart tones in clinic. Pt was subsequently induced with uncomplicated vaginal delivery. - genetic testing today per pt request - per prior MFM recommendations will plan to start antenatal testing at [redacted]wks GA  4. Nausea and vomiting in pregnancy:  - provided refill for diclegis and provided strict return precautions for worsening symptoms or inability tolerating po fluids - will continue to monitor wt gain at f/u appt in 4 wks  Preterm labor symptoms and general obstetric precautions including but not limited to  vaginal bleeding, contractions, leaking of fluid and fetal movement were reviewed in detail with the patient. Please refer to After Visit Summary for other counseling recommendations.   Return in about 4 weeks (around 02/03/2020) for f/u OB visit, any provider.  Future Appointments  Date Time Provider Department Center  02/03/2020  2:30 PM  Johnny Bridge, MD [facility] None    Sheila Oats, MD  [facility] Fellow

## 2020-01-07 LAB — CERVICOVAGINAL ANCILLARY ONLY
Bacterial Vaginitis (gardnerella): NEGATIVE
Candida Glabrata: NEGATIVE
Candida Vaginitis: NEGATIVE
Chlamydia: NEGATIVE
Comment: NEGATIVE
Comment: NEGATIVE
Comment: NEGATIVE
Comment: NEGATIVE
Comment: NEGATIVE
Comment: NORMAL
Neisseria Gonorrhea: NEGATIVE
Trichomonas: NEGATIVE

## 2020-01-08 LAB — URINE CULTURE, OB REFLEX

## 2020-01-08 LAB — CULTURE, OB URINE

## 2020-01-09 LAB — COMPREHENSIVE METABOLIC PANEL
ALT: 15 IU/L (ref 0–32)
AST: 14 IU/L (ref 0–40)
Albumin/Globulin Ratio: 1.3 (ref 1.2–2.2)
Albumin: 4.3 g/dL (ref 3.9–5.0)
Alkaline Phosphatase: 71 IU/L (ref 48–121)
BUN/Creatinine Ratio: 14 (ref 9–23)
BUN: 7 mg/dL (ref 6–20)
Bilirubin Total: <0.2 mg/dL (ref 0.0–1.2)
CO2: 22 mmol/L (ref 20–29)
Calcium: 9.7 mg/dL (ref 8.7–10.2)
Chloride: 100 mmol/L (ref 96–106)
Creatinine, Ser: 0.5 mg/dL — ABNORMAL LOW (ref 0.57–1.00)
GFR calc Af Amer: 151 mL/min/{1.73_m2} (ref >59)
GFR calc non Af Amer: 131 mL/min/{1.73_m2} (ref >59)
Globulin, Total: 3.2 g/dL (ref 1.5–4.5)
Glucose: 103 mg/dL — ABNORMAL HIGH (ref 65–99)
Potassium: 4.2 mmol/L (ref 3.5–5.2)
Sodium: 134 mmol/L (ref 134–144)
Total Protein: 7.5 g/dL (ref 6.0–8.5)

## 2020-01-09 LAB — HEMOGLOBIN A1C
Est. average glucose Bld gHb Est-mCnc: 105 mg/dL
Hgb A1c MFr Bld: 5.3 % (ref 4.8–5.6)

## 2020-01-09 LAB — CBC/D/PLT+RPR+RH+ABO+RUB AB...
Basophils Absolute: 0 10*3/uL (ref 0.0–0.2)
Basos: 0 %
EOS (ABSOLUTE): 0.1 10*3/uL (ref 0.0–0.4)
Eos: 2 %
HCV Ab: <0.1 s/co ratio (ref 0.0–0.9)
HIV Screen 4th Generation wRfx: NONREACTIVE
Hematocrit: 38.9 % (ref 34.0–46.6)
Hemoglobin: 13.6 g/dL (ref 11.1–15.9)
Hepatitis B Surface Ag: NEGATIVE
Immature Grans (Abs): 0 10*3/uL (ref 0.0–0.1)
Immature Granulocytes: 0 %
Lymphocytes Absolute: 2 10*3/uL (ref 0.7–3.1)
Lymphs: 21 %
MCH: 28.3 pg (ref 26.6–33.0)
MCHC: 35 g/dL (ref 31.5–35.7)
MCV: 81 fL (ref 79–97)
Monocytes Absolute: 0.6 10*3/uL (ref 0.1–0.9)
Monocytes: 7 %
Neutrophils Absolute: 6.7 10*3/uL (ref 1.4–7.0)
Neutrophils: 70 %
Platelets: 373 10*3/uL (ref 150–450)
RBC: 4.8 x10E6/uL (ref 3.77–5.28)
RDW: 13.9 % (ref 11.7–15.4)
RPR Ser Ql: NONREACTIVE
Rh Factor: POSITIVE
Rubella Antibodies, IGG: 18.8 index (ref >0.99)
WBC: 9.6 10*3/uL (ref 3.4–10.8)

## 2020-01-09 LAB — PROTEIN / CREATININE RATIO, URINE
Creatinine, Urine: 36.2 mg/dL
Protein, Ur: 4.4 mg/dL
Protein/Creat Ratio: 122 mg/g creat (ref 0–200)

## 2020-01-09 LAB — TSH: TSH: 1.2 u[IU]/mL (ref 0.450–4.500)

## 2020-01-09 LAB — AB SCR+ANTIBODY ID: Antibody Screen: POSITIVE — AB

## 2020-01-09 LAB — HCV INTERPRETATION

## 2020-01-12 ENCOUNTER — Encounter: Payer: Self-pay | Admitting: Obstetrics and Gynecology

## 2020-01-12 ENCOUNTER — Telehealth: Payer: Self-pay | Admitting: Obstetrics and Gynecology

## 2020-01-12 NOTE — Telephone Encounter (Signed)
Called pt regarding results of initial prenatal labs. Given no answer, left voicemail encouraging pt to check her MyChart.

## 2020-01-13 ENCOUNTER — Other Ambulatory Visit: Payer: Self-pay

## 2020-01-13 NOTE — Progress Notes (Signed)
PA completed and faxed for Nausea Rx Doxylamine-Pyridoxine 10-10mg  Will await INS response on approval or denial.

## 2020-01-15 ENCOUNTER — Encounter: Payer: Self-pay | Admitting: Obstetrics and Gynecology

## 2020-02-03 ENCOUNTER — Encounter: Payer: BLUE CROSS/BLUE SHIELD | Admitting: Obstetrics and Gynecology

## 2020-02-04 DIAGNOSIS — Z419 Encounter for procedure for purposes other than remedying health state, unspecified: Secondary | ICD-10-CM | POA: Diagnosis not present

## 2020-02-12 ENCOUNTER — Encounter: Payer: BLUE CROSS/BLUE SHIELD | Admitting: Obstetrics

## 2020-03-01 ENCOUNTER — Ambulatory Visit: Payer: BLUE CROSS/BLUE SHIELD

## 2020-03-05 DIAGNOSIS — Z419 Encounter for procedure for purposes other than remedying health state, unspecified: Secondary | ICD-10-CM | POA: Diagnosis not present

## 2020-04-05 DIAGNOSIS — Z419 Encounter for procedure for purposes other than remedying health state, unspecified: Secondary | ICD-10-CM | POA: Diagnosis not present

## 2020-05-05 DIAGNOSIS — Z419 Encounter for procedure for purposes other than remedying health state, unspecified: Secondary | ICD-10-CM | POA: Diagnosis not present

## 2020-06-05 DIAGNOSIS — Z419 Encounter for procedure for purposes other than remedying health state, unspecified: Secondary | ICD-10-CM | POA: Diagnosis not present

## 2020-07-06 DIAGNOSIS — Z419 Encounter for procedure for purposes other than remedying health state, unspecified: Secondary | ICD-10-CM | POA: Diagnosis not present

## 2020-08-03 DIAGNOSIS — Z419 Encounter for procedure for purposes other than remedying health state, unspecified: Secondary | ICD-10-CM | POA: Diagnosis not present

## 2020-09-03 DIAGNOSIS — Z419 Encounter for procedure for purposes other than remedying health state, unspecified: Secondary | ICD-10-CM | POA: Diagnosis not present

## 2020-10-03 DIAGNOSIS — Z419 Encounter for procedure for purposes other than remedying health state, unspecified: Secondary | ICD-10-CM | POA: Diagnosis not present

## 2020-10-15 DIAGNOSIS — E559 Vitamin D deficiency, unspecified: Secondary | ICD-10-CM | POA: Diagnosis not present

## 2020-10-15 DIAGNOSIS — B373 Candidiasis of vulva and vagina: Secondary | ICD-10-CM | POA: Diagnosis not present

## 2020-10-15 DIAGNOSIS — Z20828 Contact with and (suspected) exposure to other viral communicable diseases: Secondary | ICD-10-CM | POA: Diagnosis not present

## 2020-10-15 DIAGNOSIS — Z7189 Other specified counseling: Secondary | ICD-10-CM | POA: Diagnosis not present

## 2020-10-15 DIAGNOSIS — R300 Dysuria: Secondary | ICD-10-CM | POA: Diagnosis not present

## 2020-10-15 DIAGNOSIS — R5381 Other malaise: Secondary | ICD-10-CM | POA: Diagnosis not present

## 2020-10-15 DIAGNOSIS — N390 Urinary tract infection, site not specified: Secondary | ICD-10-CM | POA: Diagnosis not present

## 2020-10-15 DIAGNOSIS — E785 Hyperlipidemia, unspecified: Secondary | ICD-10-CM | POA: Diagnosis not present

## 2020-10-18 DIAGNOSIS — N760 Acute vaginitis: Secondary | ICD-10-CM | POA: Diagnosis not present

## 2020-10-18 DIAGNOSIS — D649 Anemia, unspecified: Secondary | ICD-10-CM | POA: Diagnosis not present

## 2020-10-18 DIAGNOSIS — B373 Candidiasis of vulva and vagina: Secondary | ICD-10-CM | POA: Diagnosis not present

## 2020-10-18 DIAGNOSIS — E559 Vitamin D deficiency, unspecified: Secondary | ICD-10-CM | POA: Diagnosis not present

## 2020-11-03 DIAGNOSIS — Z419 Encounter for procedure for purposes other than remedying health state, unspecified: Secondary | ICD-10-CM | POA: Diagnosis not present

## 2020-12-03 DIAGNOSIS — Z419 Encounter for procedure for purposes other than remedying health state, unspecified: Secondary | ICD-10-CM | POA: Diagnosis not present

## 2021-01-03 DIAGNOSIS — Z419 Encounter for procedure for purposes other than remedying health state, unspecified: Secondary | ICD-10-CM | POA: Diagnosis not present

## 2021-02-03 DIAGNOSIS — Z419 Encounter for procedure for purposes other than remedying health state, unspecified: Secondary | ICD-10-CM | POA: Diagnosis not present

## 2021-03-05 DIAGNOSIS — Z419 Encounter for procedure for purposes other than remedying health state, unspecified: Secondary | ICD-10-CM | POA: Diagnosis not present

## 2021-04-05 DIAGNOSIS — Z419 Encounter for procedure for purposes other than remedying health state, unspecified: Secondary | ICD-10-CM | POA: Diagnosis not present

## 2021-04-13 DIAGNOSIS — J069 Acute upper respiratory infection, unspecified: Secondary | ICD-10-CM | POA: Diagnosis not present

## 2021-04-13 DIAGNOSIS — J029 Acute pharyngitis, unspecified: Secondary | ICD-10-CM | POA: Diagnosis not present

## 2021-04-13 DIAGNOSIS — Z7189 Other specified counseling: Secondary | ICD-10-CM | POA: Diagnosis not present

## 2021-04-13 DIAGNOSIS — R051 Acute cough: Secondary | ICD-10-CM | POA: Diagnosis not present

## 2021-04-13 DIAGNOSIS — J111 Influenza due to unidentified influenza virus with other respiratory manifestations: Secondary | ICD-10-CM | POA: Diagnosis not present

## 2021-05-05 DIAGNOSIS — Z419 Encounter for procedure for purposes other than remedying health state, unspecified: Secondary | ICD-10-CM | POA: Diagnosis not present

## 2021-06-05 DIAGNOSIS — Z419 Encounter for procedure for purposes other than remedying health state, unspecified: Secondary | ICD-10-CM | POA: Diagnosis not present

## 2021-07-06 DIAGNOSIS — Z419 Encounter for procedure for purposes other than remedying health state, unspecified: Secondary | ICD-10-CM | POA: Diagnosis not present

## 2021-08-03 DIAGNOSIS — Z419 Encounter for procedure for purposes other than remedying health state, unspecified: Secondary | ICD-10-CM | POA: Diagnosis not present

## 2021-09-03 DIAGNOSIS — Z419 Encounter for procedure for purposes other than remedying health state, unspecified: Secondary | ICD-10-CM | POA: Diagnosis not present

## 2021-10-03 DIAGNOSIS — Z419 Encounter for procedure for purposes other than remedying health state, unspecified: Secondary | ICD-10-CM | POA: Diagnosis not present

## 2021-11-03 DIAGNOSIS — Z419 Encounter for procedure for purposes other than remedying health state, unspecified: Secondary | ICD-10-CM | POA: Diagnosis not present

## 2021-12-03 DIAGNOSIS — Z419 Encounter for procedure for purposes other than remedying health state, unspecified: Secondary | ICD-10-CM | POA: Diagnosis not present

## 2022-01-03 DIAGNOSIS — Z419 Encounter for procedure for purposes other than remedying health state, unspecified: Secondary | ICD-10-CM | POA: Diagnosis not present

## 2022-02-03 DIAGNOSIS — Z419 Encounter for procedure for purposes other than remedying health state, unspecified: Secondary | ICD-10-CM | POA: Diagnosis not present

## 2022-02-13 ENCOUNTER — Ambulatory Visit (INDEPENDENT_AMBULATORY_CARE_PROVIDER_SITE_OTHER): Payer: Medicaid Other

## 2022-02-13 VITALS — BP 120/77 | HR 87 | Ht 64.0 in | Wt 150.0 lb

## 2022-02-13 DIAGNOSIS — Z3201 Encounter for pregnancy test, result positive: Secondary | ICD-10-CM

## 2022-02-13 LAB — POCT URINE PREGNANCY: Preg Test, Ur: POSITIVE — AB

## 2022-02-13 NOTE — Progress Notes (Signed)
Valerie Clark presents today for UPT. She has no unusual complaints.  LMP:12/25/2021    OBJECTIVE: Appears well, in no apparent distress.  OB History     Gravida  6   Para  3   Term  2   Preterm  1   AB  1   Living  2      SAB  1   IAB      Ectopic      Multiple  0   Live Births  2          Home UPT Result: POSITIVE In-Office UPT result: POSITIVE  I have reviewed the patient's medical, obstetrical, social, and family histories, and medications.   ASSESSMENT: Positive pregnancy test  LMP  12/25/2021 EDD  10/01/2022 GA     [redacted]w[redacted]d  PLAN Prenatal care to be completed at: [facility]

## 2022-02-27 ENCOUNTER — Ambulatory Visit (INDEPENDENT_AMBULATORY_CARE_PROVIDER_SITE_OTHER): Payer: Medicaid Other

## 2022-02-27 VITALS — BP 111/74 | HR 89 | Ht 64.0 in | Wt 147.8 lb

## 2022-02-27 DIAGNOSIS — Z3A09 9 weeks gestation of pregnancy: Secondary | ICD-10-CM

## 2022-02-27 DIAGNOSIS — Z3480 Encounter for supervision of other normal pregnancy, unspecified trimester: Secondary | ICD-10-CM

## 2022-02-27 DIAGNOSIS — Z3481 Encounter for supervision of other normal pregnancy, first trimester: Secondary | ICD-10-CM

## 2022-02-27 DIAGNOSIS — O0990 Supervision of high risk pregnancy, unspecified, unspecified trimester: Secondary | ICD-10-CM | POA: Insufficient documentation

## 2022-02-27 DIAGNOSIS — O3680X0 Pregnancy with inconclusive fetal viability, not applicable or unspecified: Secondary | ICD-10-CM

## 2022-02-27 MED ORDER — FAMOTIDINE 20 MG PO TABS: 20.0000 mg | ORAL_TABLET | Freq: Two times a day (BID) | ORAL | 3 refills | Status: DC

## 2022-02-27 MED ORDER — CITRANATAL BLOOM 90-1 MG PO TABS: 1.0000 | ORAL_TABLET | Freq: Every day | ORAL | 11 refills | Status: DC

## 2022-02-27 MED ORDER — DICLEGIS 10-10 MG PO TBEC: 2.0000 | DELAYED_RELEASE_TABLET | Freq: Every day | ORAL | 5 refills | Status: DC

## 2022-02-27 NOTE — Progress Notes (Signed)
New OB Intake  I connected with  Valerie Clark on 02/27/22 at  1:10 PM EDT by in person and verified that I am speaking with the correct person using two identifiers. Nurse is located at [facility] and pt is located at [place].  I discussed the limitations, risks, security and privacy concerns of performing an evaluation and management service by telephone and the availability of in person appointments. I also discussed with the patient that there may be a patient responsible charge related to this service. The patient expressed understanding and agreed to proceed.  I explained I am completing New OB Intake today. We discussed her EDD of 10/01/22 that is based on LMP of 12/25/21. Pt is G6/P2122. I reviewed her allergies, medications, Medical/Surgical/OB history, and appropriate screenings. I informed her of [facility] services. [facility] information placed in AVS. Based on history, this is a/an  pregnancy hx of IUFD .   Patient Active Problem List   Diagnosis Date Noted   Nausea and vomiting in pregnancy prior to [redacted] weeks gestation 01/06/2020   GERD (gastroesophageal reflux disease) 01/06/2020   Encounter for supervision of normal pregnancy, unspecified, first trimester 12/30/2019   Supervision of high risk pregnancy, antepartum 08/26/2018   History of gestational diabetes mellitus (GDM) in prior pregnancy, currently pregnant in first trimester 06/10/2018   Supervision of high-risk pregnancy 02/06/2018   Low serum vitamin D 05/12/2016   History of IUFD 05/08/2016    Concerns addressed today  Delivery Plans Plans to deliver at [facility]. Patient given information for [facility] Healthy Baby website for more information about [facility] and [place]. Patient is not interested in water birth. Offered upcoming OB visit with CNM to discuss further.  MyChart/Babyscripts MyChart access verified. I explained pt will have some visits in office and some virtually. Babyscripts instructions given and order placed.  Patient verifies receipt of registration text/e-mail. Account successfully created and app downloaded.  Blood Pressure Cuff/Weight Scale Blood pressure cuff ordered for patient to pick-up from [employer]. Explained after first prenatal appt pt will check weekly and document in 28. Patient does not  have weight scale. Weight scale ordered for patient to pick up from [employer].   Anatomy US Explained first scheduled [place] will be around 19 weeks. Dating and viability scan performed today. Anatomy US to be scheduled at [place] visit.  Labs Discussed Johnsie Cancel genetic screening with patient. Would like both Panorama and Horizon drawn at new OB visit. Routine prenatal labs needed.  Covid Vaccine Patient has covid vaccine.   Is patient a CenteringPregnancy candidate?  Not a Candidate Declined due to Group Setting Not a candidate due to  Centering Patient" indicated on sticky note  Social Determinants of Health Food Insecurity: Patient denies food insecurity. WIC Referral: Patient is interested in referral to [facility].  Transportation: Patient denies transportation needs. Childcare: Discussed no children allowed at ultrasound appointments. Offered childcare services; patient declines childcare services at this time.  First visit review I reviewed new OB appt with pt. I explained she will have a provider visit that includes prenatal labs, std screening, pap smear, genetic screening, and discuss plan of care for pregnancy. Explained pt will be seen by Fatima Blank at first visit; encounter routed to appropriate provider. Explained that patient will be seen by pregnancy navigator following visit with provider.   Lucianne Lei, RN 02/27/2022  1:09 PM

## 2022-03-05 DIAGNOSIS — Z419 Encounter for procedure for purposes other than remedying health state, unspecified: Secondary | ICD-10-CM | POA: Diagnosis not present

## 2022-04-05 DIAGNOSIS — Z419 Encounter for procedure for purposes other than remedying health state, unspecified: Secondary | ICD-10-CM | POA: Diagnosis not present

## 2022-04-19 ENCOUNTER — Other Ambulatory Visit (HOSPITAL_COMMUNITY)
Admission: RE | Admit: 2022-04-19 | Discharge: 2022-04-19 | Disposition: A | Discharge status: Home / Self Care | Payer: Medicaid Other | Source: Ambulatory Visit | Attending: Advanced Practice Midwife | Admitting: Advanced Practice Midwife

## 2022-04-19 ENCOUNTER — Institutional Professional Consult (permissible substitution): Payer: Medicaid Other | Admitting: Licensed Clinical Social Worker

## 2022-04-19 ENCOUNTER — Ambulatory Visit (INDEPENDENT_AMBULATORY_CARE_PROVIDER_SITE_OTHER): Payer: Medicaid Other | Admitting: Advanced Practice Midwife

## 2022-04-19 VITALS — BP 119/72 | HR 94 | Wt 145.6 lb

## 2022-04-19 DIAGNOSIS — Z3480 Encounter for supervision of other normal pregnancy, unspecified trimester: Secondary | ICD-10-CM | POA: Diagnosis not present

## 2022-04-19 DIAGNOSIS — Z8759 Personal history of other complications of pregnancy, childbirth and the puerperium: Secondary | ICD-10-CM

## 2022-04-19 DIAGNOSIS — O0992 Supervision of high risk pregnancy, unspecified, second trimester: Secondary | ICD-10-CM

## 2022-04-19 DIAGNOSIS — Z8632 Personal history of gestational diabetes: Secondary | ICD-10-CM

## 2022-04-19 DIAGNOSIS — Z3A16 16 weeks gestation of pregnancy: Secondary | ICD-10-CM | POA: Diagnosis not present

## 2022-04-19 DIAGNOSIS — Z3482 Encounter for supervision of other normal pregnancy, second trimester: Secondary | ICD-10-CM

## 2022-04-19 DIAGNOSIS — O3472 Maternal care for abnormality of vulva and perineum, second trimester: Secondary | ICD-10-CM

## 2022-04-19 DIAGNOSIS — O09291 Supervision of pregnancy with other poor reproductive or obstetric history, first trimester: Secondary | ICD-10-CM

## 2022-04-19 DIAGNOSIS — Z3A19 19 weeks gestation of pregnancy: Secondary | ICD-10-CM

## 2022-04-19 DIAGNOSIS — O09292 Supervision of pregnancy with other poor reproductive or obstetric history, second trimester: Secondary | ICD-10-CM | POA: Diagnosis not present

## 2022-04-19 DIAGNOSIS — O3470 Maternal care for abnormality of vulva and perineum, unspecified trimester: Secondary | ICD-10-CM

## 2022-04-19 DIAGNOSIS — O0990 Supervision of high risk pregnancy, unspecified, unspecified trimester: Secondary | ICD-10-CM

## 2022-04-19 MED ORDER — ASPIRIN 81 MG PO TBEC: 81.0000 mg | DELAYED_RELEASE_TABLET | Freq: Every day | ORAL | 5 refills | Status: DC

## 2022-04-19 NOTE — Progress Notes (Signed)
Patient presents for New OB. Patient complains of having vaginal swelling and pressure after she stands for an hour and has to take a break. She states that she had this happen with her last pregnancy which resolved after birth. Denies vaginal discharge, odor, or irritation.

## 2022-04-19 NOTE — Progress Notes (Signed)
Subjective:   Zilda Ciolino is a 31 y.o. [id number] at [redacted]w[redacted]d by LMP c/w 9 week [place] being seen today for her first obstetrical visit.  Her obstetrical history is significant for  IUFD at 23 weeks, GDM  and has History of IUFD; Low serum vitamin D; History of gestational diabetes mellitus (GDM) in prior pregnancy, currently pregnant in first trimester; GERD (gastroesophageal reflux disease); and Supervision of high-risk pregnancy on their problem list.. Patient does intend to breast feed. Pregnancy history fully reviewed.  Patient reports  vulvar pressure and swelling. She had this in her previous pregnancy in the third trimester but it is starting early in this pregnancy .  HISTORY: OB History  Gravida Para Term Preterm AB Living  6 3 2 1 2 2   SAB IAB Ectopic Multiple Live Births  2 0 0 0 2    # Outcome Date GA Lbr Len/2nd Weight Sex Delivery Anes PTL Lv  6 Current           5 Term 08/26/18 [redacted]w[redacted]d 06:38 / 00:25 7 lb (3.175 kg) F Vag-Spont Local  LIV     Name: KAUR,GIRL Kamyia     Apgar1: 8  Apgar5: 9  4 Term 11/12/16 [redacted]w[redacted]d 00:48 / 00:27 6 lb 13.7 oz (3.11 kg) F Vag-Spont None  LIV     Birth Comments: wnl     Name: KAUR,GIRL Onyx     Apgar1: 9  Apgar5: 9  3 Preterm 12/10/14 [redacted]w[redacted]d   F Vag-Spont   FD  2 SAB           1 SAB            Past Medical History:  Diagnosis Date   Diabetes mellitus without complication ([place])    Medical history non-contributory    Past Surgical History:  Procedure Laterality Date   NO PAST SURGERIES     Family History  Family history unknown: Yes   Social History   Tobacco Use   Smoking status: Never   Smokeless tobacco: Never  Vaping Use   Vaping Use: Never used  Substance Use Topics   Alcohol use: No   Drug use: No   No Known Allergies Current Outpatient Medications on File Prior to Visit  Medication Sig Dispense Refill   DICLEGIS 10-10 MG TBEC Take 2 tablets by mouth at bedtime. If symptoms persist, add one tablet in the morning and  one in the afternoon 100 tablet 5   famotidine (PEPCID) 20 MG tablet Take 1 tablet (20 mg total) by mouth at bedtime. 30 tablet 1   Prenatal-DSS-FeCb-FeGl-FA (CITRANATAL BLOOM) 90-1 MG TABS Take 1 tablet by mouth daily. 30 tablet 11   famotidine (PEPCID) 20 MG tablet Take 1 tablet (20 mg total) by mouth 2 (two) times daily. 60 tablet 3   No current facility-administered medications on file prior to visit.     Indications for ASA therapy (per uptodate) One of the following: Previous pregnancy with preeclampsia, especially early onset and with an adverse outcome No Multifetal gestation No Chronic hypertension No Type 1 or 2 diabetes mellitus No Chronic kidney disease No Autoimmune disease (antiphospholipid syndrome, systemic lupus erythematosus) No   Two or more of the following: Nulliparity No Obesity (body mass index >30 kg/m2) No Family history of preeclampsia in mother or sister No Age ?35 years No Sociodemographic characteristics (African American race, low socioeconomic level) No Personal risk factors (eg, previous pregnancy with low birth weight or small for gestational age infant,  previous adverse pregnancy outcome [eg, stillbirth], interval >10 years between pregnancies) Yes   Indications for early 1 hour GTT (per uptodate)  BMI >25 (>23 in Asian women) AND one of the following  Gestational diabetes mellitus in a previous pregnancy Yes Previous stillbirth of unknown cause Yes  Exam   Vitals:   04/19/22 0923  BP: 119/72  Pulse: 94  Weight: 145 lb 9.6 oz (66 kg)   Fetal Heart Rate (bpm): 150  VS reviewed, nursing note reviewed,  Constitutional: well developed, well nourished, no distress HEENT: normocephalic CV: normal rate Pulm/chest wall: normal effort Breast Exam:  deferred Abdomen: soft Neuro: alert and oriented x 3 Skin: warm, dry Psych: affect normal Pelvic exam: Cervix pink, visually closed, without lesion, scant white creamy discharge, vaginal walls  and external genitalia normal  On visual inspection, no edema or erythema of labia noted. Pt reports symptom increases with standing.  Assessment:   Pregnancy: [id number] Patient Active Problem List   Diagnosis Date Noted   Supervision of high-risk pregnancy 02/27/2022   GERD (gastroesophageal reflux disease) 01/06/2020   History of gestational diabetes mellitus (GDM) in prior pregnancy, currently pregnant in first trimester 06/10/2018   Low serum vitamin D 05/12/2016   History of IUFD 05/08/2016     Plan:    1. Supervision of high risk pregnancy, antepartum --Anticipatory guidance about next visits/weeks of pregnancy given.   --Ordered 19 week MFM Detail anatomy US  - AFP, Serum, Open Spina Bifida - CBC/D/Plt+RPR+Rh+ABO+RubIgG... - Panorama Prenatal Test Full Panel - Cervicovaginal ancillary only( [facility]) - Cytology - PAP( [facility])  2. Abnormality of vulva during pregnancy, antepartum --No visual edema on exam with pt supine, pt reports swelling, vulva hard and painful when standing. --Recommend pregnancy support and add vulvar support belt if needed  3. History of gestational diabetes mellitus (GDM) in prior pregnancy, currently pregnant in first trimester --Needs early A1C, not collected today  4. History of IUFD --At 23 weeks, antenatal testing at 32 weeks with last pregnancy  - aspirin EC 81 MG tablet; Take 1 tablet (81 mg total) by mouth daily. Swallow whole.  Dispense: 30 tablet; Refill: 5   Initial labs drawn. Continue prenatal vitamins. Discussed and offered genetic screening options, including Quad screen/AFP, NIPS testing, and option to decline testing. Benefits/risks/alternatives reviewed. Pt aware that anatomy US is form of genetic screening with lower accuracy in detecting trisomies than blood work.  Pt chooses genetic screening today. NIPS: ordered. Ultrasound discussed; fetal anatomic survey: ordered. Problem list reviewed and updated. The nature  of [facility] with multiple MDs and other Advanced Practice Providers was explained to patient; also emphasized that residents, students are part of our team. Routine obstetric precautions reviewed. Return in about 4 weeks (around 05/17/2022) for HROB, Any provider.   Sharen Counter, CNM 04/19/22 12:33 PM

## 2022-04-21 LAB — CERVICOVAGINAL ANCILLARY ONLY
Chlamydia: NEGATIVE
Comment: NEGATIVE
Comment: NEGATIVE
Comment: NORMAL
Neisseria Gonorrhea: NEGATIVE
Trichomonas: NEGATIVE

## 2022-04-21 LAB — AFP, SERUM, OPEN SPINA BIFIDA
AFP MoM: 0.67
AFP Value: 23.2 ng/mL
Gest. Age on Collection Date: 16 weeks
Maternal Age At EDD: 31.9 yr
OSBR Risk 1 IN: 10000
Test Results:: NEGATIVE
Weight: 145 [lb_av]

## 2022-04-22 LAB — CBC/D/PLT+RPR+RH+ABO+RUBIGG...
Basophils Absolute: 0 10*3/uL (ref 0.0–0.2)
Basos: 0 %
EOS (ABSOLUTE): 0.1 10*3/uL (ref 0.0–0.4)
Eos: 1 %
HCV Ab: NONREACTIVE
HIV Screen 4th Generation wRfx: NONREACTIVE
Hematocrit: 37.1 % (ref 34.0–46.6)
Hemoglobin: 12.6 g/dL (ref 11.1–15.9)
Hepatitis B Surface Ag: NEGATIVE
Immature Grans (Abs): 0.1 10*3/uL (ref 0.0–0.1)
Immature Granulocytes: 1 %
Lymphocytes Absolute: 1.5 10*3/uL (ref 0.7–3.1)
Lymphs: 14 %
MCH: 27.6 pg (ref 26.6–33.0)
MCHC: 34 g/dL (ref 31.5–35.7)
MCV: 81 fL (ref 79–97)
Monocytes Absolute: 0.5 10*3/uL (ref 0.1–0.9)
Monocytes: 5 %
Neutrophils Absolute: 8.6 10*3/uL — ABNORMAL HIGH (ref 1.4–7.0)
Neutrophils: 79 %
Platelets: 372 10*3/uL (ref 150–450)
RBC: 4.56 x10E6/uL (ref 3.77–5.28)
RDW: 15.3 % (ref 11.7–15.4)
RPR Ser Ql: NONREACTIVE
Rh Factor: POSITIVE
Rubella Antibodies, IGG: 13.3 index (ref >0.99)
WBC: 10.9 10*3/uL — ABNORMAL HIGH (ref 3.4–10.8)

## 2022-04-22 LAB — AB SCR+ANTIBODY ID

## 2022-04-22 LAB — HCV INTERPRETATION

## 2022-04-24 LAB — CYTOLOGY - PAP
Comment: NEGATIVE
Diagnosis: NEGATIVE
High risk HPV: NEGATIVE

## 2022-04-25 LAB — PANORAMA PRENATAL TEST FULL PANEL:PANORAMA TEST PLUS 5 ADDITIONAL MICRODELETIONS: FETAL FRACTION: 4.7

## 2022-05-02 ENCOUNTER — Other Ambulatory Visit: Payer: Self-pay | Admitting: *Deleted

## 2022-05-02 DIAGNOSIS — O0990 Supervision of high risk pregnancy, unspecified, unspecified trimester: Secondary | ICD-10-CM

## 2022-05-02 MED ORDER — PRENATAL VITAMIN 27-0.8 MG PO TABS: 1.0000 | ORAL_TABLET | Freq: Every day | ORAL | 11 refills | Status: DC

## 2022-05-02 NOTE — Progress Notes (Signed)
Citranatal Bloom not available at pharmacy. Generic PNV RX sent as substitution.

## 2022-05-05 DIAGNOSIS — Z419 Encounter for procedure for purposes other than remedying health state, unspecified: Secondary | ICD-10-CM | POA: Diagnosis not present

## 2022-05-17 ENCOUNTER — Ambulatory Visit (INDEPENDENT_AMBULATORY_CARE_PROVIDER_SITE_OTHER): Payer: Medicaid Other | Admitting: Student

## 2022-05-17 VITALS — BP 125/69 | HR 92 | Wt 148.6 lb

## 2022-05-17 DIAGNOSIS — O09292 Supervision of pregnancy with other poor reproductive or obstetric history, second trimester: Secondary | ICD-10-CM

## 2022-05-17 DIAGNOSIS — O0992 Supervision of high risk pregnancy, unspecified, second trimester: Secondary | ICD-10-CM

## 2022-05-17 DIAGNOSIS — Z8632 Personal history of gestational diabetes: Secondary | ICD-10-CM

## 2022-05-17 DIAGNOSIS — Z8759 Personal history of other complications of pregnancy, childbirth and the puerperium: Secondary | ICD-10-CM

## 2022-05-17 DIAGNOSIS — Z3A20 20 weeks gestation of pregnancy: Secondary | ICD-10-CM

## 2022-05-17 DIAGNOSIS — O0990 Supervision of high risk pregnancy, unspecified, unspecified trimester: Secondary | ICD-10-CM

## 2022-05-17 DIAGNOSIS — O09291 Supervision of pregnancy with other poor reproductive or obstetric history, first trimester: Secondary | ICD-10-CM

## 2022-05-17 NOTE — Progress Notes (Signed)
   PRENATAL VISIT NOTE  Subjective:  Valerie Clark is a 31 y.o. [id number] at [redacted]w[redacted]d being seen today for ongoing prenatal care.  She is currently monitored for the following issues for this low-risk pregnancy and has History of IUFD; Low serum vitamin D; History of gestational diabetes mellitus (GDM) in prior pregnancy, currently pregnant in first trimester; GERD (gastroesophageal reflux disease); and Supervision of high-risk pregnancy on their problem list.  Patient reports no complaints.  Contractions: Not present. Vag. Bleeding: None.  Movement: Present. Denies leaking of fluid.   The following portions of the patient's history were reviewed and updated as appropriate: allergies, current medications, past family history, past medical history, past social history, past surgical history and problem list.   Objective:   Vitals:   05/17/22 0850  BP: 125/69  Pulse: 92  Weight: 148 lb 9.6 oz (67.4 kg)    Fetal Status: Fetal Heart Rate (bpm): 150   Movement: Present     General:  Alert, oriented and cooperative. Patient is in no acute distress.  Skin: Skin is warm and dry. No rash noted.   Cardiovascular: Normal heart rate noted  Respiratory: Normal respiratory effort, no problems with respiration noted  Abdomen: Soft, gravid, appropriate for gestational age.  Pain/Pressure: Absent     Pelvic: Cervical exam deferred        Extremities: Normal range of motion.  Edema: Trace  Mental Status: Normal mood and affect. Normal behavior. Normal judgment and thought content.   Assessment and Plan:  Pregnancy: [id number] at [redacted]w[redacted]d 1. Supervision of high risk pregnancy, antepartum - Fetal movement noted  - Hemoglobin A1c - Culture, OB Urine  2. [redacted] weeks gestation of pregnancy - Plan for routine follow-up - Anatomy scan later this week  3. History of gestational diabetes mellitus (GDM) in prior pregnancy, currently pregnant in first trimester - Collecting Ha1c today - Explained implications of  screening today  4. History of IUFD - Will plan for antenatal testing at 32 weeks  Preterm labor symptoms and general obstetric precautions including but not limited to vaginal bleeding, contractions, leaking of fluid and fetal movement were reviewed in detail with the patient. Please refer to After Visit Summary for other counseling recommendations.   Return in about 4 weeks (around 06/14/2022) for LOB, IN-PERSON.  Future Appointments  Date Time Provider Department Center  05/19/2022  1:30 PM [facility] NURSE [facility]  05/19/2022  1:45 PM [facility] US5 [facility]    Corlis Hove, NP

## 2022-05-18 LAB — HEMOGLOBIN A1C
Est. average glucose Bld gHb Est-mCnc: 103 mg/dL
Hgb A1c MFr Bld: 5.2 % (ref 4.8–5.6)

## 2022-05-19 ENCOUNTER — Ambulatory Visit: Payer: Medicaid Other | Admitting: *Deleted

## 2022-05-19 ENCOUNTER — Other Ambulatory Visit: Payer: Self-pay | Admitting: *Deleted

## 2022-05-19 ENCOUNTER — Ambulatory Visit: Payer: Medicaid Other | Attending: Advanced Practice Midwife

## 2022-05-19 VITALS — BP 123/65 | HR 88

## 2022-05-19 DIAGNOSIS — O0992 Supervision of high risk pregnancy, unspecified, second trimester: Secondary | ICD-10-CM | POA: Diagnosis not present

## 2022-05-19 DIAGNOSIS — Z362 Encounter for other antenatal screening follow-up: Secondary | ICD-10-CM

## 2022-05-19 DIAGNOSIS — Z3A19 19 weeks gestation of pregnancy: Secondary | ICD-10-CM | POA: Insufficient documentation

## 2022-05-19 DIAGNOSIS — Z8759 Personal history of other complications of pregnancy, childbirth and the puerperium: Secondary | ICD-10-CM

## 2022-05-19 DIAGNOSIS — O24419 Gestational diabetes mellitus in pregnancy, unspecified control: Secondary | ICD-10-CM

## 2022-05-19 LAB — URINE CULTURE, OB REFLEX

## 2022-05-19 LAB — CULTURE, OB URINE

## 2022-06-05 DIAGNOSIS — Z419 Encounter for procedure for purposes other than remedying health state, unspecified: Secondary | ICD-10-CM | POA: Diagnosis not present

## 2022-06-05 NOTE — L&D Delivery Note (Signed)
OB/GYN Faculty Practice Delivery Note  Valerie Clark is a 32 y.o. [id number] s/p VD at [redacted]w[redacted]d. She was admitted for IOL h/o IUFD.   ROM: 3h 66m with clear fluid GBS Status:  Negative/-- (04/02 1104) Maximum Maternal Temperature: 99.30F  Labor Progress: Initial SVE: 3/50/-3. She then progressed to complete.   Delivery Date/Time: 09/29/22 0938 Delivery: Called to room and patient was complete and pushing. Head delivered LOA. Nuchal cord present x1. Shoulder and body delivered in usual fashion. Infant with spontaneous cry, placed on mother's abdomen, dried and stimulated. Cord clamped x 2 after 1-minute delay, and cut by FOB. Cord blood drawn. Placenta delivered spontaneously with gentle cord traction. Fundus firm with massage and Pitocin. Labia, perineum, vagina, and cervix inspected with 2nd degree laceration, repaired with 2-0 vicryl.  Baby Weight: pending  Placenta: 3 vessel, intact. Sent to L&D Complications: None Lacerations: as above EBL: 101 mL Analgesia: Epidural   Infant:  APGAR (1 MIN): 8   APGAR (5 MINS): 9    Myrtie Hawk, DO OB Family Medicine Fellow, [facility] for [employer], [facility] 09/29/2022, 9:59 AM

## 2022-06-14 ENCOUNTER — Ambulatory Visit (INDEPENDENT_AMBULATORY_CARE_PROVIDER_SITE_OTHER): Payer: Medicaid Other | Admitting: Obstetrics and Gynecology

## 2022-06-14 ENCOUNTER — Encounter: Payer: Self-pay | Admitting: Obstetrics and Gynecology

## 2022-06-14 VITALS — BP 115/71 | HR 97 | Wt 152.6 lb

## 2022-06-14 DIAGNOSIS — Z3A24 24 weeks gestation of pregnancy: Secondary | ICD-10-CM

## 2022-06-14 DIAGNOSIS — O0992 Supervision of high risk pregnancy, unspecified, second trimester: Secondary | ICD-10-CM

## 2022-06-14 DIAGNOSIS — Z8759 Personal history of other complications of pregnancy, childbirth and the puerperium: Secondary | ICD-10-CM

## 2022-06-14 DIAGNOSIS — O0990 Supervision of high risk pregnancy, unspecified, unspecified trimester: Secondary | ICD-10-CM

## 2022-06-14 NOTE — Patient Instructions (Signed)
Oral Glucose Tolerance Test During Pregnancy Why am I having this test? The oral glucose tolerance test (OGTT) is done to check how your body processes blood sugar (glucose). This is one of several tests used to diagnose diabetes that develops during pregnancy (gestational diabetes mellitus). Gestational diabetes is a short-term form of diabetes that some women develop while they are pregnant. It usually occurs during the second trimester of pregnancy and goes away after delivery. Testing, or screening, for gestational diabetes usually occurs at weeks 24-28 of pregnancy. You may have the OGTT test after having a 1-hour glucose screening test if the results from that test indicate that you may have gestational diabetes. This test may also be needed if: You have a history of gestational diabetes. There is a history of giving birth to very large babies or of losing pregnancies (having stillbirths). You have signs and symptoms of diabetes, such as: Changes in your eyesight. Tingling or numbness in your hands or feet. Changes in hunger, thirst, and urination, and these are not explained by your pregnancy. What is being tested? This test measures the amount of glucose in your blood at different times during a period of 3 hours. This shows how well your body can process glucose. What kind of sample is taken?  Blood samples are required for this test. They are usually collected by inserting a needle into a blood vessel. How do I prepare for this test? For 3 days before your test, eat normally. Have plenty of carbohydrate-rich foods. Follow instructions from your health care provider about: Eating or drinking restrictions on the day of the test. You may be asked not to eat or drink anything other than water (to fast) starting 8-10 hours before the test. Changing or stopping your regular medicines. Some medicines may interfere with this test. Tell a health care provider about: All medicines you are  taking, including vitamins, herbs, eye drops, creams, and over-the-counter medicines. Any blood disorders you have. Any surgeries you have had. Any medical conditions you have. What happens during the test? First, your blood glucose will be measured. This is referred to as your fasting blood glucose because you fasted before the test. Then, you will drink a glucose solution that contains a certain amount of glucose. Your blood glucose will be measured again 1, 2, and 3 hours after you drink the solution. This test takes about 3 hours to complete. You will need to stay at the testing location during this time. During the testing period: Do not eat or drink anything other than the glucose solution. Do not exercise. Do not use any products that contain nicotine or tobacco, such as cigarettes, e-cigarettes, and chewing tobacco. These can affect your test results. If you need help quitting, ask your health care provider. The testing procedure may vary among health care providers and hospitals. How are the results reported? Your results will be reported as milligrams of glucose per deciliter of blood (mg/dL) or millimoles per liter (mmol/L). There is more than one source for screening and diagnosis reference values used to diagnose gestational diabetes. Your health care provider will compare your results to normal values that were established after testing a large group of people (reference values). Reference values may vary among labs and hospitals. For this test ([place]arp[place]-Coustan), reference values are: Fasting: 95 mg/dL (5.3 mmol/L). 1 hour: 180 mg/dL (10.0 mmol/L). 2 hour: 155 mg/dL (8.6 mmol/L). 3 hour: 140 mg/dL (7.8 mmol/L). What do the results mean? Results below the reference values are   considered normal. If two or more of your blood glucose levels are at or above the reference values, you may be diagnosed with gestational diabetes. If only one level is high, your health care provider may  suggest repeat testing or other tests to confirm a diagnosis. Talk with your health care provider about what your results mean. Questions to ask your health care provider Ask your health care provider, or the department that is doing the test: When will my results be ready? How will I get my results? What are my treatment options? What other tests do I need? What are my next steps? Summary The oral glucose tolerance test (OGTT) is one of several tests used to diagnose diabetes that develops during pregnancy (gestational diabetes mellitus). Gestational diabetes is a short-term form of diabetes that some women develop while they are pregnant. You may have the OGTT test after having a 1-hour glucose screening test if the results from that test show that you may have gestational diabetes. You may also have this test if you have any symptoms or risk factors for this type of diabetes. Talk with your health care provider about what your results mean. This information is not intended to replace advice given to you by your health care provider. Make sure you discuss any questions you have with your health care provider. Document Revised: 12/27/2021 Document Reviewed: 10/30/2019 Elsevier Patient Education  2023 Elsevier Inc.  

## 2022-06-14 NOTE — Progress Notes (Signed)
  HIGH-RISK PREGNANCY OFFICE VISIT Patient name: Valerie Clark MRN [id number]  Date of birth: 01/03/1991 Chief Complaint:   Routine Prenatal Visit  History of Present Illness:   Shanese Riemenschneider is a 32 y.o. [id number] female at [redacted]w[redacted]d with an Estimated Date of Delivery: 10/01/22 being seen today for ongoing management of a high-risk pregnancy complicated by  h/o 2nd trimester IUFD and gHTN in 3rd trimester of last pregnancy  Today she reports no complaints. She reports she was not aware of Rx for bASA and the need for her to take one daily. She states, "I will go pick up the Rx today and start taking it." Contractions: Not present. Vag. Bleeding: None.  Movement: Present. denies leaking of fluid.  Review of Systems:   Pertinent items are noted in HPI Denies abnormal vaginal discharge w/ itching/odor/irritation, headaches, visual changes, shortness of breath, chest pain, abdominal pain, severe nausea/vomiting, or problems with urination or bowel movements unless otherwise stated above. Pertinent History Reviewed:  Reviewed past medical,surgical, social, obstetrical and family history.  Reviewed problem list, medications and allergies. Physical Assessment:   Vitals:   06/14/22 0856  BP: 115/71  Pulse: 97  Weight: 152 lb 9.6 oz (69.2 kg)  Body mass index is 26.19 kg/m.           Physical Examination:   General appearance: alert, well appearing, and in no distress, oriented to person, place, and time, and normal appearing weight  Mental status: alert, oriented to person, place, and time, normal mood, behavior, speech, dress, motor activity, and thought processes  Skin: warm & dry   Extremities: Edema: None    Cardiovascular: normal heart rate noted  Respiratory: normal respiratory effort, no distress  Abdomen: gravid, soft, non-tender  Pelvic: Cervical exam deferred         Fetal Status: Fetal Heart Rate (bpm): 155 Fundal Height: 23 cm Movement: Present    Fetal Surveillance Testing today:  none  No results found for this or any previous visit (from the past 24 hour(s)).  Assessment & Plan:  1) High-risk pregnancy [id number] at [redacted]w[redacted]d with an Estimated Date of Delivery: 10/01/22   2) Supervision of high risk pregnancy, antepartum - Anticipatory guidance for 2 hr GTT - advised to fast after midnight without anything to eat or drink (except for water), will have fasting blood drawn, drink the glucola drink (flavor choices: orange or fruit punch), have a visit with a provider during the first hour of testing, wait in the lab waiting room to have blood drawn at 1 hour and then 2 hours after finishing glucola drink.  - MFM U/S next on 06/23/2022  3) History of IUFD - Will start taking bASA daily today  4) [redacted] weeks gestation of pregnancy   Meds: No orders of the defined types were placed in this encounter.  Labs/procedures today: none   Reviewed: Preterm labor symptoms and general obstetric precautions including but not limited to vaginal bleeding, contractions, leaking of fluid and fetal movement were reviewed in detail with the patient.  All questions were answered. Has home bp cuff. Check bp weekly, let us know if >140/90.   Follow-up: Return in about 4 weeks (around 07/12/2022) for Return OB 2hr GTT.  No orders of the defined types were placed in this encounter.  Laury Deep MSN, CNM 06/14/2022 10:31 AM

## 2022-06-14 NOTE — Progress Notes (Signed)
Patient presents for ROB. Patient has no concerns today. 

## 2022-06-23 ENCOUNTER — Ambulatory Visit: Payer: Medicaid Other | Admitting: *Deleted

## 2022-06-23 ENCOUNTER — Ambulatory Visit: Payer: Medicaid Other | Attending: Maternal & Fetal Medicine

## 2022-06-23 ENCOUNTER — Other Ambulatory Visit: Payer: Self-pay | Admitting: *Deleted

## 2022-06-23 VITALS — BP 108/63 | HR 81

## 2022-06-23 DIAGNOSIS — Z8632 Personal history of gestational diabetes: Secondary | ICD-10-CM

## 2022-06-23 DIAGNOSIS — O09292 Supervision of pregnancy with other poor reproductive or obstetric history, second trimester: Secondary | ICD-10-CM

## 2022-06-23 DIAGNOSIS — O24419 Gestational diabetes mellitus in pregnancy, unspecified control: Secondary | ICD-10-CM | POA: Insufficient documentation

## 2022-06-23 DIAGNOSIS — Z8759 Personal history of other complications of pregnancy, childbirth and the puerperium: Secondary | ICD-10-CM | POA: Insufficient documentation

## 2022-06-23 DIAGNOSIS — Z3A25 25 weeks gestation of pregnancy: Secondary | ICD-10-CM

## 2022-06-23 DIAGNOSIS — O0992 Supervision of high risk pregnancy, unspecified, second trimester: Secondary | ICD-10-CM | POA: Diagnosis not present

## 2022-06-23 DIAGNOSIS — Z362 Encounter for other antenatal screening follow-up: Secondary | ICD-10-CM | POA: Diagnosis not present

## 2022-07-06 DIAGNOSIS — Z419 Encounter for procedure for purposes other than remedying health state, unspecified: Secondary | ICD-10-CM | POA: Diagnosis not present

## 2022-07-12 ENCOUNTER — Other Ambulatory Visit: Payer: Medicaid Other

## 2022-07-12 ENCOUNTER — Ambulatory Visit (INDEPENDENT_AMBULATORY_CARE_PROVIDER_SITE_OTHER): Payer: Medicaid Other | Admitting: Obstetrics and Gynecology

## 2022-07-12 VITALS — BP 106/71 | HR 101 | Wt 154.0 lb

## 2022-07-12 DIAGNOSIS — Z8632 Personal history of gestational diabetes: Secondary | ICD-10-CM

## 2022-07-12 DIAGNOSIS — O0993 Supervision of high risk pregnancy, unspecified, third trimester: Secondary | ICD-10-CM

## 2022-07-12 DIAGNOSIS — Z8759 Personal history of other complications of pregnancy, childbirth and the puerperium: Secondary | ICD-10-CM

## 2022-07-12 DIAGNOSIS — Z3A28 28 weeks gestation of pregnancy: Secondary | ICD-10-CM

## 2022-07-12 DIAGNOSIS — O09291 Supervision of pregnancy with other poor reproductive or obstetric history, first trimester: Secondary | ICD-10-CM

## 2022-07-12 DIAGNOSIS — O09293 Supervision of pregnancy with other poor reproductive or obstetric history, third trimester: Secondary | ICD-10-CM

## 2022-07-12 DIAGNOSIS — O0992 Supervision of high risk pregnancy, unspecified, second trimester: Secondary | ICD-10-CM

## 2022-07-12 NOTE — Progress Notes (Signed)
Pt presents for ROB. Gtt and 3rd trimester labs drawn today.  Tdap given today.  Declined flu vaccine.

## 2022-07-12 NOTE — Progress Notes (Signed)
   PRENATAL VISIT NOTE  Subjective:  Valerie Clark is a 32 y.o. K2H0623 at [redacted]w[redacted]d being seen today for ongoing prenatal care.  She is currently monitored for the following issues for this high-risk pregnancy and has History of IUFD; Low serum vitamin D; History of gestational diabetes mellitus (GDM) in prior pregnancy, currently pregnant in first trimester; GERD (gastroesophageal reflux disease); and Supervision of high-risk pregnancy on their problem list.  Patient reports no complaints.  Contractions: Not present. Vag. Bleeding: None.  Movement: Present. Denies leaking of fluid.   The following portions of the patient's history were reviewed and updated as appropriate: allergies, current medications, past family history, past medical history, past social history, past surgical history and problem list.   Objective:   Vitals:   07/12/22 0951  BP: 106/71  Pulse: (!) 101  Weight: 154 lb (69.9 kg)   Fetal Status: Fetal Heart Rate (bpm): 150 Fundal Height: 26 cm Movement: Present     General:  Alert, oriented and cooperative. Patient is in no acute distress.  Skin: Skin is warm and dry. No rash noted.   Cardiovascular: Normal heart rate noted  Respiratory: Normal respiratory effort, no problems with respiration noted  Abdomen: Soft, gravid, appropriate for gestational age.  Pain/Pressure: Absent      Assessment and Plan:  Pregnancy: [id number] at [redacted]w[redacted]d 1. Supervision of high risk pregnancy in second trimester 2. [redacted] weeks gestation of pregnancy CBC, RPR, HIV, & 2h GTT collected  Tdap today Flu declined  3. History of gestational diabetes mellitus (GDM) in prior pregnancy, currently pregnant in first trimester 2h GTT today  4. History of IUFD Growth scheduled 2/9 Weekly BPP to start at [place]  Please refer to After Visit Summary for other counseling recommendations.   Return in about 2 weeks (around 07/26/2022) for return OB at 30 weeks.  Future Appointments  Date Time  Provider [place]  07/12/2022 11:15 AM Inez Catalina, MD [facility] None  07/14/2022  8:30 AM [facility] NURSE [facility]  07/14/2022  8:45 AM [facility]7 [facility]  08/11/2022  9:15 AM [facility]SE [facility]  08/11/2022  9:30 AM [facility] US3 [facility] [place]   Inez Catalina, MD

## 2022-07-13 LAB — HIV ANTIBODY (ROUTINE TESTING W REFLEX): HIV Screen 4th Generation wRfx: NONREACTIVE

## 2022-07-13 LAB — GLUCOSE TOLERANCE, 2 HOURS W/ 1HR
Glucose, 1 hour: 138 mg/dL (ref 70–179)
Glucose, 2 hour: 110 mg/dL (ref 70–152)
Glucose, Fasting: 78 mg/dL (ref 70–91)

## 2022-07-13 LAB — CBC
Hematocrit: 36.4 % (ref 34.0–46.6)
Hemoglobin: 12.4 g/dL (ref 11.1–15.9)
MCH: 28.4 pg (ref 26.6–33.0)
MCHC: 34.1 g/dL (ref 31.5–35.7)
MCV: 84 fL (ref 79–97)
Platelets: 330 10*3/uL (ref 150–450)
RBC: 4.36 x10E6/uL (ref 3.77–5.28)
RDW: 13.1 % (ref 11.7–15.4)
WBC: 9.4 10*3/uL (ref 3.4–10.8)

## 2022-07-13 LAB — RPR: RPR Ser Ql: NONREACTIVE

## 2022-07-14 ENCOUNTER — Ambulatory Visit: Payer: Medicaid Other | Attending: Maternal & Fetal Medicine

## 2022-07-14 ENCOUNTER — Other Ambulatory Visit: Payer: Self-pay

## 2022-07-14 ENCOUNTER — Ambulatory Visit: Payer: Medicaid Other

## 2022-07-14 VITALS — BP 121/71 | HR 93

## 2022-07-14 DIAGNOSIS — O09293 Supervision of pregnancy with other poor reproductive or obstetric history, third trimester: Secondary | ICD-10-CM | POA: Diagnosis not present

## 2022-07-14 DIAGNOSIS — Z8759 Personal history of other complications of pregnancy, childbirth and the puerperium: Secondary | ICD-10-CM

## 2022-07-14 DIAGNOSIS — Z8632 Personal history of gestational diabetes: Secondary | ICD-10-CM | POA: Diagnosis not present

## 2022-07-14 DIAGNOSIS — O0993 Supervision of high risk pregnancy, unspecified, third trimester: Secondary | ICD-10-CM

## 2022-07-14 DIAGNOSIS — Z3689 Encounter for other specified antenatal screening: Secondary | ICD-10-CM

## 2022-07-14 DIAGNOSIS — Z3A28 28 weeks gestation of pregnancy: Secondary | ICD-10-CM | POA: Diagnosis not present

## 2022-07-21 ENCOUNTER — Ambulatory Visit: Payer: Medicaid Other

## 2022-07-28 ENCOUNTER — Ambulatory Visit (INDEPENDENT_AMBULATORY_CARE_PROVIDER_SITE_OTHER): Payer: Medicaid Other | Admitting: Obstetrics and Gynecology

## 2022-07-28 VITALS — BP 113/77 | HR 97 | Wt 158.0 lb

## 2022-07-28 DIAGNOSIS — Z3A30 30 weeks gestation of pregnancy: Secondary | ICD-10-CM

## 2022-07-28 DIAGNOSIS — O09291 Supervision of pregnancy with other poor reproductive or obstetric history, first trimester: Secondary | ICD-10-CM

## 2022-07-28 DIAGNOSIS — O0993 Supervision of high risk pregnancy, unspecified, third trimester: Secondary | ICD-10-CM

## 2022-07-28 DIAGNOSIS — Z8632 Personal history of gestational diabetes: Secondary | ICD-10-CM

## 2022-07-28 DIAGNOSIS — O09293 Supervision of pregnancy with other poor reproductive or obstetric history, third trimester: Secondary | ICD-10-CM

## 2022-07-28 DIAGNOSIS — Z8759 Personal history of other complications of pregnancy, childbirth and the puerperium: Secondary | ICD-10-CM

## 2022-07-28 NOTE — Progress Notes (Signed)
   PRENATAL VISIT NOTE  Subjective:  Valerie Clark is a 32 y.o. [id number] at 34w5dbeing seen today for ongoing prenatal care.  She is currently monitored for the following issues for this high-risk pregnancy and has History of IUFD; Low serum vitamin D; History of gestational diabetes mellitus (GDM) in prior pregnancy, currently pregnant in first trimester; GERD (gastroesophageal reflux disease); and Supervision of high-risk pregnancy on their problem list.  Patient reports  backache/pelvic pain that improved w/ rest & icy hot, round ligament pain, and acne. Had episode of DFM yesterday but has been otherwise moving normally. No current pain or DFM. .Marland Kitchen Contractions: Not present. Vag. Bleeding: None.  Movement: Present. Denies leaking of fluid.   The following portions of the patient's history were reviewed and updated as appropriate: allergies, current medications, past family history, past medical history, past social history, past surgical history and problem list.   Objective:   Vitals:   07/28/22 0827  BP: 113/77  Pulse: 97  Weight: 158 lb (71.7 kg)   Fetal Status: Fetal Heart Rate (bpm): 150   Movement: Present     General:  Alert, oriented and cooperative. Patient is in no acute distress.  Skin: Skin is warm and dry. No rash noted.   Cardiovascular: Normal heart rate noted  Respiratory: Normal respiratory effort, no problems with respiration noted  Abdomen: Soft, gravid, appropriate for gestational age.  Pain/Pressure: Absent      Assessment and Plan:  Pregnancy: [id number] 348w5d. Supervision of high risk pregnancy in third trimester 2. [redacted] weeks gestation of pregnancy Reviewed PTL precautions, round ligament pain, fetal kick counts, and safe OTC medications for acne/hyperpigmentation  3. History of IUFD @ 28/5 1138g (13%) AC 16%, ceph, posterior, AFI 12.2 - next growth [place]/8 Planned for weekly BPPs at 32 weeks  4. History of gestational diabetes mellitus (GDM) in prior  pregnancy, currently pregnant in first trimester Normal 2h GTT!  Preterm labor symptoms and general obstetric precautions including but not limited to vaginal bleeding, contractions, leaking of fluid and fetal movement were reviewed in detail with the patient.  Please refer to After Visit Summary for other counseling recommendations.   Return in about 2 weeks (around 08/11/2022).  Future Appointments  Date Time Provider [place]/01/2023  9:15 AM [facility] NURSE [facility]/01[facility]2023  9:30 AM [facility]/04/2023  8:55 AM Constant, Peggy, MD [facility] None  08/18/2022  8:45 AM [facility]/15/2024  9:00 AM [facility]/22/2024  8:30 AM [facility]/22/2024  8:45 AM [facility]3/28/2024  8:45 AM [facility]URSE [facility]/28/20[facility]  9:00 AM [facility] US1 [facility]   KyInez CatalinaMD

## 2022-07-28 NOTE — Patient Instructions (Signed)
Face medications - look at the active ingredient Benzoyl Peroxide  Salicylic Acid   Pain medicine for back pain  - Tylenol extra strength (2 pills at a time, no more than 3 times per day) - Icy hot or tiger balm - Maternity belt  Please go to the MAU if: - Your pain gets worse or doesn't get better with medicines and rest - Your water breaks - You have bleeding - Baby is not moving normally after doing kick counts  You can also call us if you are unsure about what to do

## 2022-07-28 NOTE — Progress Notes (Signed)
Pt would like to discuss back and pelvic pain.  Pt does states she uses a maternity support belt.

## 2022-08-04 DIAGNOSIS — Z419 Encounter for procedure for purposes other than remedying health state, unspecified: Secondary | ICD-10-CM | POA: Diagnosis not present

## 2022-08-11 ENCOUNTER — Ambulatory Visit: Payer: Medicaid Other | Admitting: *Deleted

## 2022-08-11 ENCOUNTER — Ambulatory Visit: Payer: Medicaid Other | Attending: Maternal & Fetal Medicine

## 2022-08-11 VITALS — BP 122/65 | HR 97

## 2022-08-11 DIAGNOSIS — O0993 Supervision of high risk pregnancy, unspecified, third trimester: Secondary | ICD-10-CM | POA: Insufficient documentation

## 2022-08-11 DIAGNOSIS — Z8632 Personal history of gestational diabetes: Secondary | ICD-10-CM | POA: Insufficient documentation

## 2022-08-11 DIAGNOSIS — Z3A32 32 weeks gestation of pregnancy: Secondary | ICD-10-CM

## 2022-08-11 DIAGNOSIS — O09293 Supervision of pregnancy with other poor reproductive or obstetric history, third trimester: Secondary | ICD-10-CM | POA: Insufficient documentation

## 2022-08-11 DIAGNOSIS — Z3689 Encounter for other specified antenatal screening: Secondary | ICD-10-CM | POA: Insufficient documentation

## 2022-08-11 DIAGNOSIS — Z8759 Personal history of other complications of pregnancy, childbirth and the puerperium: Secondary | ICD-10-CM | POA: Insufficient documentation

## 2022-08-14 ENCOUNTER — Ambulatory Visit (INDEPENDENT_AMBULATORY_CARE_PROVIDER_SITE_OTHER): Payer: Medicaid Other | Admitting: Obstetrics and Gynecology

## 2022-08-14 ENCOUNTER — Encounter: Payer: Self-pay | Admitting: Obstetrics and Gynecology

## 2022-08-14 VITALS — BP 107/69 | HR 94 | Wt 160.0 lb

## 2022-08-14 DIAGNOSIS — O09293 Supervision of pregnancy with other poor reproductive or obstetric history, third trimester: Secondary | ICD-10-CM

## 2022-08-14 DIAGNOSIS — Z3A33 33 weeks gestation of pregnancy: Secondary | ICD-10-CM

## 2022-08-14 DIAGNOSIS — O09291 Supervision of pregnancy with other poor reproductive or obstetric history, first trimester: Secondary | ICD-10-CM

## 2022-08-14 DIAGNOSIS — Z8759 Personal history of other complications of pregnancy, childbirth and the puerperium: Secondary | ICD-10-CM

## 2022-08-14 DIAGNOSIS — O0993 Supervision of high risk pregnancy, unspecified, third trimester: Secondary | ICD-10-CM

## 2022-08-14 DIAGNOSIS — Z8632 Personal history of gestational diabetes: Secondary | ICD-10-CM

## 2022-08-14 NOTE — Progress Notes (Signed)
Pt presents for ROB. Has weekly BPP

## 2022-08-14 NOTE — Progress Notes (Signed)
   PRENATAL VISIT NOTE  Subjective:  Valerie Clark is a 32 y.o. [id number] at [redacted]w[redacted]d being seen today for ongoing prenatal care.  She is currently monitored for the following issues for this high-risk pregnancy and has History of IUFD; Low serum vitamin D; History of gestational diabetes mellitus (GDM) in prior pregnancy, currently pregnant in first trimester; GERD (gastroesophageal reflux disease); and Supervision of high-risk pregnancy on their problem list.  Patient reports no complaints.  Contractions: Not present. Vag. Bleeding: None.  Movement: Present. Denies leaking of fluid.   The following portions of the patient's history were reviewed and updated as appropriate: allergies, current medications, past family history, past medical history, past social history, past surgical history and problem list.   Objective:   Vitals:   08/14/22 0857  BP: 107/69  Pulse: 94  Weight: 160 lb (72.6 kg)    Fetal Status: Fetal Heart Rate (bpm): 155 Fundal Height: 33 cm Movement: Present     General:  Alert, oriented and cooperative. Patient is in no acute distress.  Skin: Skin is warm and dry. No rash noted.   Cardiovascular: Normal heart rate noted  Respiratory: Normal respiratory effort, no problems with respiration noted  Abdomen: Soft, gravid, appropriate for gestational age.  Pain/Pressure: Absent     Pelvic: Cervical exam deferred        Extremities: Normal range of motion.  Edema: None  Mental Status: Normal mood and affect. Normal behavior. Normal judgment and thought content.   Assessment and Plan:  Pregnancy: [id number] at [redacted]w[redacted]d 1. Supervision of high risk pregnancy in third trimester Patient is doing well without complaints  2. History of IUFD Continue antenatal testing Hoping for delivery at 39 weeks  3. History of gestational diabetes mellitus (GDM) in prior pregnancy, currently pregnant in first trimester Normal 2 hour glucola screening  Preterm labor symptoms and general  obstetric precautions including but not limited to vaginal bleeding, contractions, leaking of fluid and fetal movement were reviewed in detail with the patient. Please refer to After Visit Summary for other counseling recommendations.   Return in about 2 weeks (around 08/28/2022) for in person, ROB, High risk.  Future Appointments  Date Time Provider [place]  08/18/2022  8:45 AM [facility] NURSE [facility]  08/18/2022  9:00 AM [facility] US[facility]  08/25/2022  8:30 AM [facility] NURSE [facility]  08/25/2022  8:45 AM [facility] US4 [facility]  08/31/2022  8:45 AM [facility] NURSE [facility]  08/31/2022  9:00 AM [facility] US1 [facility]    Mora Bellman, MD

## 2022-08-18 ENCOUNTER — Ambulatory Visit: Payer: Medicaid Other | Attending: Obstetrics

## 2022-08-18 ENCOUNTER — Ambulatory Visit: Payer: Medicaid Other | Admitting: *Deleted

## 2022-08-18 ENCOUNTER — Ambulatory Visit: Payer: Medicaid Other

## 2022-08-18 ENCOUNTER — Other Ambulatory Visit: Payer: Self-pay | Admitting: *Deleted

## 2022-08-18 VITALS — BP 116/66 | HR 82

## 2022-08-18 DIAGNOSIS — Z3A33 33 weeks gestation of pregnancy: Secondary | ICD-10-CM

## 2022-08-18 DIAGNOSIS — O09293 Supervision of pregnancy with other poor reproductive or obstetric history, third trimester: Secondary | ICD-10-CM | POA: Diagnosis not present

## 2022-08-18 DIAGNOSIS — Z3689 Encounter for other specified antenatal screening: Secondary | ICD-10-CM | POA: Insufficient documentation

## 2022-08-18 DIAGNOSIS — Z8759 Personal history of other complications of pregnancy, childbirth and the puerperium: Secondary | ICD-10-CM | POA: Insufficient documentation

## 2022-08-18 DIAGNOSIS — Z8632 Personal history of gestational diabetes: Secondary | ICD-10-CM | POA: Diagnosis not present

## 2022-08-18 DIAGNOSIS — O0993 Supervision of high risk pregnancy, unspecified, third trimester: Secondary | ICD-10-CM | POA: Insufficient documentation

## 2022-08-25 ENCOUNTER — Ambulatory Visit: Payer: Medicaid Other

## 2022-08-28 ENCOUNTER — Ambulatory Visit (INDEPENDENT_AMBULATORY_CARE_PROVIDER_SITE_OTHER): Payer: Medicaid Other | Admitting: Obstetrics and Gynecology

## 2022-08-28 VITALS — BP 111/68 | HR 80 | Wt 165.0 lb

## 2022-08-28 DIAGNOSIS — Z8759 Personal history of other complications of pregnancy, childbirth and the puerperium: Secondary | ICD-10-CM

## 2022-08-28 DIAGNOSIS — O0993 Supervision of high risk pregnancy, unspecified, third trimester: Secondary | ICD-10-CM

## 2022-08-28 DIAGNOSIS — Z3A35 35 weeks gestation of pregnancy: Secondary | ICD-10-CM

## 2022-08-28 NOTE — Progress Notes (Signed)
   PRENATAL VISIT NOTE  Subjective:  Valerie Clark is a 32 y.o. [id number] at [redacted]w[redacted]d being seen today for ongoing prenatal care.  She is currently monitored for the following issues for this high-risk pregnancy and has History of IUFD; Low serum vitamin D; History of gestational diabetes mellitus (GDM) in prior pregnancy, currently pregnant in first trimester; GERD (gastroesophageal reflux disease); and Supervision of high-risk pregnancy on their problem list.  Patient reports no complaints.  Contractions: Not present. Vag. Bleeding: None.  Movement: Present. Denies leaking of fluid.   The following portions of the patient's history were reviewed and updated as appropriate: allergies, current medications, past family history, past medical history, past social history, past surgical history and problem list.   Objective:   Vitals:   08/28/22 1534  BP: 111/68  Pulse: 80  Weight: 165 lb (74.8 kg)   Fetal Status: Fetal Heart Rate (bpm): 143   Movement: Present     General:  Alert, oriented and cooperative. Patient is in no acute distress.  Skin: Skin is warm and dry. No rash noted.   Cardiovascular: Normal heart rate noted  Respiratory: Normal respiratory effort, no problems with respiration noted  Abdomen: Soft, gravid, appropriate for gestational age.  Pain/Pressure: Absent      Assessment and Plan:  Pregnancy: [id number] at [redacted]w[redacted]d 1. Supervision of high risk pregnancy in third trimester 2. [redacted] weeks gestation of pregnancy @32 /5: 2094g (49%), ceph, post, BPP 8/8 Offered GBS/GC/CT this appt, she would prefer to wait until next week  3. History of IUFD Discussed TOD. Pt had some confusion about multiple opinions and being told she could be induced any time after 37 weeks. Was induced at 64 weeks with her daughters. We discussed that IOL at 39 weeks is reasonable, but that we can follow up her growth on 4/4. If anything concerning at that time or if she has DFM after 37 weeks would lean towards  an earlier induction.  Term labor symptoms and general obstetric precautions including but not limited to vaginal bleeding, contractions, leaking of fluid and fetal movement were reviewed in detail with the patient. Please refer to After Visit Summary for other counseling recommendations.   Return in about 1 week (around 09/04/2022) for return OB with GBS test.  Future Appointments  Date Time Provider [place]  09/05/2022  9:35 AM Deloris Ping, CNM [facility] None  09/07/2022  8:30 AM [facility] NURSE [facility]  09/07/2022  8:45 AM [facility] US6 [facility]  09/12/2022  9:35 AM Leftwich-Kirby, Kathie Dike, CNM [facility] None  09/19/2022  9:55 AM Inez Catalina, MD [facility] None  09/26/2022  9:35 AM Leftwich-Kirby, Kathie Dike, CNM [facility] None  10/03/2022  9:35 AM Deloris Ping, CNM [facility] None    Inez Catalina, MD

## 2022-08-31 ENCOUNTER — Ambulatory Visit: Payer: Medicaid Other

## 2022-09-04 DIAGNOSIS — Z419 Encounter for procedure for purposes other than remedying health state, unspecified: Secondary | ICD-10-CM | POA: Diagnosis not present

## 2022-09-05 ENCOUNTER — Ambulatory Visit (INDEPENDENT_AMBULATORY_CARE_PROVIDER_SITE_OTHER): Payer: Medicaid Other | Admitting: Certified Nurse Midwife

## 2022-09-05 ENCOUNTER — Other Ambulatory Visit (HOSPITAL_COMMUNITY)
Admission: RE | Admit: 2022-09-05 | Discharge: 2022-09-05 | Disposition: A | Discharge status: Home / Self Care | Payer: Medicaid Other | Source: Ambulatory Visit | Attending: Certified Nurse Midwife | Admitting: Certified Nurse Midwife

## 2022-09-05 ENCOUNTER — Encounter: Payer: Self-pay | Admitting: Certified Nurse Midwife

## 2022-09-05 VITALS — BP 118/72 | HR 87 | Wt 165.2 lb

## 2022-09-05 DIAGNOSIS — O0993 Supervision of high risk pregnancy, unspecified, third trimester: Secondary | ICD-10-CM | POA: Insufficient documentation

## 2022-09-05 DIAGNOSIS — Z3A36 36 weeks gestation of pregnancy: Secondary | ICD-10-CM

## 2022-09-05 DIAGNOSIS — R002 Palpitations: Secondary | ICD-10-CM

## 2022-09-05 DIAGNOSIS — Z8759 Personal history of other complications of pregnancy, childbirth and the puerperium: Secondary | ICD-10-CM

## 2022-09-05 NOTE — Progress Notes (Unsigned)
Pt presents for ROB visit. Pt c/o heart palpations this morning, first occurrence. Pt also c/o  lower abdominal and pelvic pain and pressure.

## 2022-09-06 LAB — CERVICOVAGINAL ANCILLARY ONLY
Chlamydia: NEGATIVE
Comment: NEGATIVE
Comment: NORMAL
Neisseria Gonorrhea: NEGATIVE

## 2022-09-07 ENCOUNTER — Ambulatory Visit: Payer: Medicaid Other | Attending: Obstetrics and Gynecology

## 2022-09-07 ENCOUNTER — Ambulatory Visit: Payer: Medicaid Other | Admitting: *Deleted

## 2022-09-07 VITALS — BP 115/59 | HR 89

## 2022-09-07 DIAGNOSIS — O09293 Supervision of pregnancy with other poor reproductive or obstetric history, third trimester: Secondary | ICD-10-CM | POA: Diagnosis not present

## 2022-09-07 DIAGNOSIS — Z8759 Personal history of other complications of pregnancy, childbirth and the puerperium: Secondary | ICD-10-CM | POA: Insufficient documentation

## 2022-09-07 DIAGNOSIS — Z8632 Personal history of gestational diabetes: Secondary | ICD-10-CM

## 2022-09-07 DIAGNOSIS — O0993 Supervision of high risk pregnancy, unspecified, third trimester: Secondary | ICD-10-CM | POA: Insufficient documentation

## 2022-09-07 DIAGNOSIS — Z3A36 36 weeks gestation of pregnancy: Secondary | ICD-10-CM | POA: Diagnosis not present

## 2022-09-07 NOTE — Progress Notes (Signed)
   PRENATAL VISIT NOTE  Subjective:  Valerie Clark is a 32 y.o. [id number] at [redacted]w[redacted]d being seen today for ongoing prenatal care.  She is currently monitored for the following issues for this high-risk pregnancy and has History of IUFD; Low serum vitamin D; History of gestational diabetes mellitus (GDM) in prior pregnancy, currently pregnant in first trimester; GERD (gastroesophageal reflux disease); and Supervision of high-risk pregnancy on their problem list.  Patient reports  heart palpitations that started this morning. Patient states she felt her heart start racing and sat to take her BP. She states her BP was normal and her HR was 108. She states she had some "brief" dizziness at the time. She denies ,pain shortness of breath or any symptoms currently.  .  Contractions: Not present. Vag. Bleeding: None.  Movement: Present. Denies leaking of fluid.   The following portions of the patient's history were reviewed and updated as appropriate: allergies, current medications, past family history, past medical history, past social history, past surgical history and problem list.   Objective:   Vitals:   09/05/22 0951  BP: 118/72  Pulse: 87  Weight: 165 lb 3.2 oz (74.9 kg)    Fetal Status: Fetal Heart Rate (bpm): 146 Fundal Height: 37 cm Movement: Present     General:  Alert, oriented and cooperative. Patient is in no acute distress.  Skin: Skin is warm and dry. No rash noted.   Cardiovascular: Normal heart rate noted  Respiratory: Normal respiratory effort, no problems with respiration noted  Abdomen: Soft, gravid, appropriate for gestational age.  Pain/Pressure: Absent     Pelvic: Cervical exam deferred        Extremities: Normal range of motion.  Edema: None  Mental Status: Normal mood and affect. Normal behavior. Normal judgment and thought content.   Assessment and Plan:  Pregnancy: [id number] at [redacted]w[redacted]d 1. Supervision of high risk pregnancy in third trimester - Patient feeling frequent and  vigorous fetal movement - Culture, beta strep (group b only) - Cervicovaginal ancillary only( [place])  2. History of IUFD  3. [redacted] weeks gestation of pregnancy - GBS collection today   4. Palpitations - Reviewed that this can be normal in pregnancy as the body demands more. Discussed worsening signs and symptoms.  - Recommended that patient go to MAU if symptoms return and patient gets worse.  - AMB Referral to Cardio Obstetrics  Perterm labor symptoms and general obstetric precautions including but not limited to vaginal bleeding, contractions, leaking of fluid and fetal movement were reviewed in detail with the patient. Please refer to After Visit Summary for other counseling recommendations.   Return in about 1 week (around 09/12/2022) for LOB.  Future Appointments  Date Time Provider [place]  09/13/2022  2:30 PM Laury Deep, CNM [place] None  09/19/2022  9:55 AM Inez Catalina, MD [facility] None  09/26/2022  9:35 AM Leftwich-Kirby, Kathie Dike, CNM [facility] None  10/03/2022  9:35 AM Deloris Ping, CNM [facility] None    Iman Reinertsen (Isaias Sakai) Rollene Rotunda, MSN, [place] for [facility]  09/07/22 9:29 PM

## 2022-09-09 LAB — CULTURE, BETA STREP (GROUP B ONLY): Strep Gp B Culture: NEGATIVE

## 2022-09-12 ENCOUNTER — Encounter: Payer: Medicaid Other | Admitting: Advanced Practice Midwife

## 2022-09-13 ENCOUNTER — Ambulatory Visit (INDEPENDENT_AMBULATORY_CARE_PROVIDER_SITE_OTHER): Payer: Medicaid Other | Admitting: Obstetrics and Gynecology

## 2022-09-13 VITALS — BP 123/81 | HR 87 | Wt 167.0 lb

## 2022-09-13 DIAGNOSIS — Z3A37 37 weeks gestation of pregnancy: Secondary | ICD-10-CM

## 2022-09-13 DIAGNOSIS — Z8759 Personal history of other complications of pregnancy, childbirth and the puerperium: Secondary | ICD-10-CM

## 2022-09-13 DIAGNOSIS — O480 Post-term pregnancy: Secondary | ICD-10-CM

## 2022-09-13 DIAGNOSIS — O0993 Supervision of high risk pregnancy, unspecified, third trimester: Secondary | ICD-10-CM

## 2022-09-13 NOTE — Progress Notes (Signed)
Pt presents for ROB. Wants to discuss induction.

## 2022-09-13 NOTE — Progress Notes (Signed)
   LOW-RISK PREGNANCY OFFICE VISIT Patient name: Valerie Clark MRN [id number]  Date of birth: Feb 09, 1991 Chief Complaint:   Routine Prenatal Visit  History of Present Illness:   Valerie Clark is a 32 y.o. [id number] female at [redacted]w[redacted]d with an Estimated Date of Delivery: 10/01/22 being seen today for ongoing management of a low-risk pregnancy.  Today she reports no complaints. She and her husband desire an eIOL at 39 weeks. Contractions: Not present. Vag. Bleeding: None.  Movement: Present. denies leaking of fluid. Review of Systems:   Pertinent items are noted in HPI Denies abnormal vaginal discharge w/ itching/odor/irritation, headaches, visual changes, shortness of breath, chest pain, abdominal pain, severe nausea/vomiting, or problems with urination or bowel movements unless otherwise stated above. Pertinent History Reviewed:  Reviewed past medical,surgical, social, obstetrical and family history.  Reviewed problem list, medications and allergies. Physical Assessment:   Vitals:   09/13/22 1440  BP: 123/81  Pulse: 87  Weight: 167 lb (75.8 kg)  Body mass index is 28.67 kg/m.        Physical Examination:   General appearance: Well appearing, and in no distress  Mental status: Alert, oriented to person, place, and time  Skin: Warm & dry  Cardiovascular: Normal heart rate noted  Respiratory: Normal respiratory effort, no distress  Abdomen: Soft, gravid, nontender  Pelvic: Cervical exam deferred         Extremities: Edema: None  Fetal Status: Fetal Heart Rate (bpm): 160 Fundal Height: 35 cm Movement: Present    No results found for this or any previous visit (from the past 24 hour(s)).  Assessment & Plan:  1) Low-risk pregnancy [id number] at [redacted]w[redacted]d with an Estimated Date of Delivery: 10/01/22   2) Supervision of high risk pregnancy in third trimester - IOL scheduled for 41 wks - IOL orders placed  3) History of IUFD  4) [redacted] weeks gestation of pregnancy     Meds: No orders of the  defined types were placed in this encounter.  Labs/procedures today: none  Plan:  Continue routine obstetrical care   Reviewed: Term labor symptoms and general obstetric precautions including but not limited to vaginal bleeding, contractions, leaking of fluid and fetal movement were reviewed in detail with the patient.  All questions were answered. Has home bp cuff. Check bp weekly, let us know if >140/90.   Follow-up: Return in about 1 week (around 09/20/2022) for Return OB visit.  No orders of the defined types were placed in this encounter.  Raelyn Mora MSN, CNM 09/13/2022 3:08 PM

## 2022-09-15 ENCOUNTER — Ambulatory Visit (INDEPENDENT_AMBULATORY_CARE_PROVIDER_SITE_OTHER): Payer: Medicaid Other | Admitting: Cardiology

## 2022-09-15 ENCOUNTER — Other Ambulatory Visit: Payer: Medicaid Other

## 2022-09-15 ENCOUNTER — Encounter: Payer: Self-pay | Admitting: Cardiology

## 2022-09-15 VITALS — BP 120/81 | HR 86 | Ht 64.0 in | Wt 167.9 lb

## 2022-09-15 DIAGNOSIS — R002 Palpitations: Secondary | ICD-10-CM | POA: Diagnosis not present

## 2022-09-15 NOTE — Progress Notes (Unsigned)
Cardio-Obstetrics Clinic  New Evaluation  Date:  09/15/2022   ID:  Valerie Clark, DOB [birth year], MRN [id number]  PCP:  Patient, No Pcp Per   [facility] Providers Cardiologist:  None  Electrophysiologist:  None   { Click to update primary MD,subspecialty MD or APP then REFRESH:1}    Referring MD: Lowella Curb*   Chief Complaint: Palpitations  History of Present Illness:    Valerie Clark is a 32 y.o. female [[id number]] who is being seen today for the evaluation of palpitations at the request of Suzie Portela, Shantonette M, C*.   She says that around 0700 on April 4th she woke up and felt like her heart rate has been very fast. She took her pulse and it was 108. She said during this time she was feeling dizzy, weak, short of breath and tired. She also took her BP and was 112/70. She said that this lasted 15-20 minutes. Denies any LOC/syncope. No leg swelling or orthopnea. No chest pain during this event. No skipped beats that she felt. She says her normal resting heart rate is in the 90s during pregnancy. Denies any issues or recurrence after this event.  Denies any cardiac history or surgeries.  Family hx- Mom has HTN  Prior CV Studies Reviewed: The following studies were reviewed today: None  Past Medical History:  Diagnosis Date   Diabetes mellitus without complication    Medical history non-contributory     Past Surgical History:  Procedure Laterality Date   NO PAST SURGERIES     { Click here to update PMH, PSH, OB Hx then refresh note  :1}   OB History     Gravida  6   Para  3   Term  2   Preterm  1   AB  2   Living  2      SAB  2   IAB      Ectopic      Multiple  0   Live Births  2           { Click here to update OB Charting then refresh note  :1}    Current Medications: Current Meds  Medication Sig   aspirin EC 81 MG tablet Take 1 tablet (81 mg total) by mouth daily. Swallow whole.   Prenatal-DSS-FeCb-FeGl-FA  (CITRANATAL BLOOM) 90-1 MG TABS Take 1 tablet by mouth daily.     Allergies:   Patient has no known allergies.   Social History   Socioeconomic History   Marital status: Married    Spouse name: Not on file   Number of children: Not on file   Years of education: Not on file   Highest education level: Not on file  Occupational History   Not on file  Tobacco Use   Smoking status: Never   Smokeless tobacco: Never  Vaping Use   Vaping Use: Never used  Substance and Sexual Activity   Alcohol use: No   Drug use: No   Sexual activity: Yes    Partners: Male    Birth control/protection: None  Other Topics Concern   Not on file  Social History Narrative   Not on file   Social Determinants of Health   Financial Resource Strain: Low Risk  (08/26/2018)   Overall Financial Resource Strain (CARDIA)    Difficulty of Paying Living Expenses: Not hard at all  Food Insecurity: No Food Insecurity (08/26/2018)   Hunger Vital Sign    Worried About Running  Out of Food in the Last Year: Never true    Ran Out of Food in the Last Year: Never true  Transportation Needs: No Transportation Needs (08/26/2018)   PRAPARE - Administrator, Civil Service (Medical): No    Lack of Transportation (Non-Medical): No  Physical Activity: Inactive (08/26/2018)   Exercise Vital Sign    Days of Exercise per Week: 0 days    Minutes of Exercise per Session: 0 min  Stress: No Stress Concern Present (08/26/2018)   [employer] of Occupational Health - Occupational Stress Questionnaire    Feeling of Stress : Only a little  Social Connections: Socially Integrated (08/26/2018)   Social Connection and Isolation Panel [NHANES]    Frequency of Communication with Friends and Family: Twice a week    Frequency of Social Gatherings with Friends and Family: Twice a week    Attends Religious Services: More than 4 times per year    Active Member of [employer] or Organizations: Yes    Attends Banker  Meetings: Never    Marital Status: Married  { Click here to update SDOH then refresh :1}    Family History  Problem Relation Age of Onset   Asthma Neg Hx    Cancer Neg Hx    Diabetes Neg Hx    Heart disease Neg Hx    Hypertension Neg Hx    Stroke Neg Hx    { Click here to update FH then refresh note    :1}   ROS:   Please see the history of present illness.    Negative for current CP, palpitations, SOB, leg swelling All other systems reviewed and are negative.   Labs/EKG Reviewed:    EKG:   EKG is not ordered today.  The ekg ordered today demonstrates ***  Recent Labs: 07/12/2022: Hemoglobin 12.4; Platelets 330   Recent Lipid Panel No results found for: "CHOL", "TRIG", "HDL", "CHOLHDL", "LDLCALC", "LDLDIRECT"  Physical Exam:    VS:  BP 120/81 (BP Location: Left Arm, Patient Position: Sitting, Cuff Size: Normal)   Pulse 86   Ht  (1.626 m)   Wt 167 lb 14.4 oz (76.2 kg)   LMP 12/25/2021 (Exact Date)   SpO2 99%   BMI 28.82 kg/m     Wt Readings from Last 3 Encounters:  09/15/22 167 lb 14.4 oz (76.2 kg)  09/13/22 167 lb (75.8 kg)  09/05/22 165 lb 3.2 oz (74.9 kg)     GEN: Well nourished, well developed in no acute distress HEENT: Normal NECK: No JVD; No carotid bruits LYMPHATICS: No lymphadenopathy CARDIAC: RRR, no murmurs, rubs, gallops RESPIRATORY:  Clear to auscultation without rales, wheezing or rhonchi  ABDOMEN: Soft, non-tender, non-distended MUSCULOSKELETAL:  trace pedal edema; No deformity  SKIN: Warm and dry NEUROLOGIC:  Alert and oriented x 3 PSYCHIATRIC:  Normal affect    Risk Assessment/Risk Calculators:   { Click to calculate CARPREG II - THEN refresh note :1}    { Click to caclulate Mod WHO Class of CV Risk - THEN refresh note :1}     { Click for CHADS2VASc Score - THEN Refresh Note    :[id number]}      ASSESSMENT & PLAN:    Palpitations She had a single episode of palpitations lasting for 15-20 minutes. Symptomatic with  dyspnea, lightheadedness at that time. No recurrence or syncope.  -Zio patch for 7 days -Encourage hydration  Patient Instructions  Medication Instructions:  Your physician recommends that you continue  on your current medications as directed. Please refer to the Current Medication list given to you today.  *If you need a refill on your cardiac medications before your next appointment, please call your pharmacy*   Lab Work: None   Testing/Procedures: Christena Deem- Long Term Monitor Instructions  Your physician has requested you wear a ZIO patch monitor for 7 days.  This is a single patch monitor. [employer] supplies one patch monitor per enrollment. Additional stickers are not available. Please do not apply patch if you will be having a Nuclear Stress Test,  Echocardiogram, Cardiac CT, MRI, or Chest Xray during the period you would be wearing the  monitor. The patch cannot be worn during these tests. You cannot remove and re-apply the  ZIO XT patch monitor.  Once you have received your monitor, please review the enclosed instructions. Your monitor  has already been registered assigning a specific monitor serial # to you.  If your monitor falls off in less than 4 days, contact our Monitor department at [phone].  If your monitor becomes loose or falls off after 4 days call [employer] at [phone] for  suggestions on securing your monitor    Follow-Up: At [facility], you and your health needs are our priority.  As part of our continuing mission to provide you with exceptional heart care, we have created designated Provider Care Teams.  These Care Teams include your primary Cardiologist (physician) and Advanced Practice Providers (APPs -  Physician Assistants and Nurse Practitioners) who all work together to provide you with the care you need, when you need it.  Your next appointment:   As needed  Provider:   Thomasene Ripple, DO    Dispo:  No follow-ups on file.    Medication Adjustments/Labs and Tests Ordered: Current medicines are reviewed at length with the patient today.  Concerns regarding medicines are outlined above.  Tests Ordered: No orders of the defined types were placed in this encounter.  Medication Changes: No orders of the defined types were placed in this encounter.

## 2022-09-15 NOTE — Patient Instructions (Signed)
Medication Instructions:  Your physician recommends that you continue on your current medications as directed. Please refer to the Current Medication list given to you today.  *If you need a refill on your cardiac medications before your next appointment, please call your pharmacy*   Lab Work: None   Testing/Procedures: Christena Deem- Long Term Monitor Instructions  Your physician has requested you wear a ZIO patch monitor for 7 days.  This is a single patch monitor. [employer] supplies one patch monitor per enrollment. Additional stickers are not available. Please do not apply patch if you will be having a Nuclear Stress Test,  Echocardiogram, Cardiac CT, MRI, or Chest Xray during the period you would be wearing the  monitor. The patch cannot be worn during these tests. You cannot remove and re-apply the  ZIO XT patch monitor.  Once you have received your monitor, please review the enclosed instructions. Your monitor  has already been registered assigning a specific monitor serial # to you.  If your monitor falls off in less than 4 days, contact our Monitor department at [phone].  If your monitor becomes loose or falls off after 4 days call [employer] at [phone] for  suggestions on securing your monitor    Follow-Up: At [facility], you and your health needs are our priority.  As part of our continuing mission to provide you with exceptional heart care, we have created designated Provider Care Teams.  These Care Teams include your primary Cardiologist (physician) and Advanced Practice Providers (APPs -  Physician Assistants and Nurse Practitioners) who all work together to provide you with the care you need, when you need it.  Your next appointment:   As needed  Provider:   Thomasene Ripple, DO

## 2022-09-19 ENCOUNTER — Ambulatory Visit (INDEPENDENT_AMBULATORY_CARE_PROVIDER_SITE_OTHER): Payer: Medicaid Other | Admitting: Obstetrics and Gynecology

## 2022-09-19 ENCOUNTER — Encounter: Payer: Self-pay | Admitting: Obstetrics and Gynecology

## 2022-09-19 VITALS — BP 117/68 | HR 80 | Wt 168.4 lb

## 2022-09-19 DIAGNOSIS — O0993 Supervision of high risk pregnancy, unspecified, third trimester: Secondary | ICD-10-CM

## 2022-09-19 DIAGNOSIS — Z3A38 38 weeks gestation of pregnancy: Secondary | ICD-10-CM

## 2022-09-19 DIAGNOSIS — Z8759 Personal history of other complications of pregnancy, childbirth and the puerperium: Secondary | ICD-10-CM

## 2022-09-19 NOTE — Patient Instructions (Addendum)
[email or web address]  Membrane sweeping next week if not called for induction.

## 2022-09-19 NOTE — Progress Notes (Signed)
   PRENATAL VISIT NOTE  Subjective:  Valerie Clark is a 32 y.o. [id number] at [redacted]w[redacted]d being seen today for ongoing prenatal care.  She is currently monitored for the following issues for this low-risk pregnancy and has History of IUFD; Low serum vitamin D; History of gestational diabetes mellitus (GDM) in prior pregnancy, currently pregnant in first trimester; GERD (gastroesophageal reflux disease); and Supervision of high-risk pregnancy on their problem list.  Patient reports no complaints.  Contractions: Not present. Vag. Bleeding: None.  Movement: Present. Denies leaking of fluid.   The following portions of the patient's history were reviewed and updated as appropriate: allergies, current medications, past family history, past medical history, past social history, past surgical history and problem list.   Objective:   Vitals:   09/19/22 0958  BP: 117/68  Pulse: 80  Weight: 168 lb 6.4 oz (76.4 kg)    Fetal Status: Fetal Heart Rate (bpm): 152 Fundal Height: 36 cm Movement: Present     General:  Alert, oriented and cooperative. Patient is in no acute distress.  Skin: Skin is warm and dry. No rash noted.   Cardiovascular: Normal heart rate noted  Respiratory: Normal respiratory effort, no problems with respiration noted  Abdomen: Soft, gravid, appropriate for gestational age.  Pain/Pressure: Absent      Assessment and Plan:  Pregnancy: A5W0981 at [redacted]w[redacted]d 1. Supervision of high risk pregnancy in third trimester 2. [redacted] weeks gestation of pregnancy Confirmed patient is on waitlist for eIOL at 39 weeks. Also has pdIOL scheduled 5/5 Discussed membrane sweep at next appointment if not called for IOL  3. History of IUFD Growth AGA on 09/07/22  Term labor symptoms and general obstetric precautions including but not limited to vaginal bleeding, contractions, leaking of fluid and fetal movement were reviewed in detail with the patient. Please refer to After Visit Summary for other counseling  recommendations.   Return in about 1 week (around 09/26/2022) for return OB at 39 weeks.  Future Appointments  Date Time Provider Department Center  09/26/2022  9:35 AM Leftwich-Kirby, Wilmer Floor, CNM [facility] None  10/03/2022  9:35 AM Carlynn Herald, CNM [facility] None  10/08/2022  6:30 AM [facility] Clovis Cao ROOM [facility] None   Lennart Pall, MD

## 2022-09-19 NOTE — Progress Notes (Addendum)
Pt presents for ROB without concerns today. IOL scheduled May 5th

## 2022-09-25 ENCOUNTER — Telehealth (HOSPITAL_COMMUNITY): Payer: Self-pay | Admitting: *Deleted

## 2022-09-25 ENCOUNTER — Encounter (HOSPITAL_COMMUNITY): Payer: Self-pay | Admitting: *Deleted

## 2022-09-25 NOTE — Telephone Encounter (Signed)
Preadmission screen  

## 2022-09-26 ENCOUNTER — Ambulatory Visit (INDEPENDENT_AMBULATORY_CARE_PROVIDER_SITE_OTHER): Payer: Medicaid Other | Admitting: Medical

## 2022-09-26 ENCOUNTER — Encounter: Payer: Self-pay | Admitting: Medical

## 2022-09-26 VITALS — BP 121/79 | HR 88 | Wt 170.2 lb

## 2022-09-26 DIAGNOSIS — K219 Gastro-esophageal reflux disease without esophagitis: Secondary | ICD-10-CM

## 2022-09-26 DIAGNOSIS — O09291 Supervision of pregnancy with other poor reproductive or obstetric history, first trimester: Secondary | ICD-10-CM

## 2022-09-26 DIAGNOSIS — Z8759 Personal history of other complications of pregnancy, childbirth and the puerperium: Secondary | ICD-10-CM

## 2022-09-26 DIAGNOSIS — Z3A39 39 weeks gestation of pregnancy: Secondary | ICD-10-CM

## 2022-09-26 DIAGNOSIS — O0993 Supervision of high risk pregnancy, unspecified, third trimester: Secondary | ICD-10-CM

## 2022-09-26 DIAGNOSIS — Z8632 Personal history of gestational diabetes: Secondary | ICD-10-CM

## 2022-09-26 NOTE — Progress Notes (Signed)
Pt presents for ROB visit. Induction scheduled. Requesting cervical check.

## 2022-09-26 NOTE — Progress Notes (Signed)
   PRENATAL VISIT NOTE  Subjective:  Valerie Clark is a 32 y.o. [id number] at [redacted]w[redacted]d being seen today for ongoing prenatal care.  She is currently monitored for the following issues for this high-risk pregnancy and has History of IUFD; Low serum vitamin D; History of gestational diabetes mellitus (GDM) in prior pregnancy, currently pregnant in first trimester; GERD (gastroesophageal reflux disease); and Supervision of high-risk pregnancy on their problem list.  Patient reports no complaints.  Contractions: Not present. Vag. Bleeding: None.  Movement: Present. Denies leaking of fluid.   The following portions of the patient's history were reviewed and updated as appropriate: allergies, current medications, past family history, past medical history, past social history, past surgical history and problem list.   Objective:   Vitals:   09/26/22 0942  BP: 121/79  Pulse: 88  Weight: 170 lb 3.2 oz (77.2 kg)    Fetal Status: Fetal Heart Rate (bpm): 141   Movement: Present     General:  Alert, oriented and cooperative. Patient is in no acute distress.  Skin: Skin is warm and dry. No rash noted.   Cardiovascular: Normal heart rate noted  Respiratory: Normal respiratory effort, no problems with respiration noted  Abdomen: Soft, gravid, appropriate for gestational age.  Pain/Pressure: Absent     Pelvic: Cervical exam performed in the presence of a chaperone Dilation: Fingertip Effacement (%): 20 Station: Ballotable  Extremities: Normal range of motion.  Edema: None  Mental Status: Normal mood and affect. Normal behavior. Normal judgment and thought content.   Assessment and Plan:  Pregnancy: A5W0981 at [redacted]w[redacted]d 1. Supervision of high risk pregnancy in third trimester - IOL already scheduled eIOL on list for 39+ weeks, if not called in on medical list for 10/08/22 - GBS negative   2. History of IUFD  3. History of gestational diabetes mellitus (GDM) in prior pregnancy, currently pregnant in first  trimester  4. Gastroesophageal reflux disease without esophagitis - resolved   5. [redacted] weeks gestation of pregnancy  Term labor symptoms and general obstetric precautions including but not limited to vaginal bleeding, contractions, leaking of fluid and fetal movement were reviewed in detail with the patient. Please refer to After Visit Summary for other counseling recommendations.   Return in about 1 week (around 10/03/2022) for [facility] APP, any provider, In-Person.  Future Appointments  Date Time Provider Department Center  10/03/2022  9:35 AM Carlynn Herald, CNM [facility] None  10/08/2022  6:30 AM [facility] SCHED ROOM [facility] None    Vonzella Nipple, PA-C

## 2022-09-28 ENCOUNTER — Encounter (HOSPITAL_COMMUNITY): Payer: Self-pay | Admitting: Obstetrics and Gynecology

## 2022-09-28 ENCOUNTER — Inpatient Hospital Stay (HOSPITAL_COMMUNITY)
Admission: RE | Admit: 2022-09-28 | Discharge: 2022-09-30 | DRG: 806 | Disposition: A | Discharge status: Home / Self Care | Payer: Medicaid Other | Attending: Obstetrics and Gynecology | Admitting: Obstetrics and Gynecology

## 2022-09-28 DIAGNOSIS — O0993 Supervision of high risk pregnancy, unspecified, third trimester: Principal | ICD-10-CM

## 2022-09-28 DIAGNOSIS — K219 Gastro-esophageal reflux disease without esophagitis: Secondary | ICD-10-CM | POA: Diagnosis not present

## 2022-09-28 DIAGNOSIS — O09292 Supervision of pregnancy with other poor reproductive or obstetric history, second trimester: Secondary | ICD-10-CM | POA: Diagnosis not present

## 2022-09-28 DIAGNOSIS — D62 Acute posthemorrhagic anemia: Secondary | ICD-10-CM | POA: Diagnosis not present

## 2022-09-28 DIAGNOSIS — Z3A39 39 weeks gestation of pregnancy: Secondary | ICD-10-CM | POA: Diagnosis not present

## 2022-09-28 DIAGNOSIS — O26893 Other specified pregnancy related conditions, third trimester: Principal | ICD-10-CM | POA: Diagnosis present

## 2022-09-28 DIAGNOSIS — Z7982 Long term (current) use of aspirin: Secondary | ICD-10-CM

## 2022-09-28 DIAGNOSIS — Z8759 Personal history of other complications of pregnancy, childbirth and the puerperium: Secondary | ICD-10-CM

## 2022-09-28 DIAGNOSIS — Z3A40 40 weeks gestation of pregnancy: Secondary | ICD-10-CM | POA: Diagnosis not present

## 2022-09-28 DIAGNOSIS — O2492 Unspecified diabetes mellitus in childbirth: Secondary | ICD-10-CM | POA: Diagnosis not present

## 2022-09-28 DIAGNOSIS — O480 Post-term pregnancy: Secondary | ICD-10-CM | POA: Diagnosis not present

## 2022-09-28 DIAGNOSIS — O0990 Supervision of high risk pregnancy, unspecified, unspecified trimester: Secondary | ICD-10-CM

## 2022-09-28 DIAGNOSIS — O9962 Diseases of the digestive system complicating childbirth: Secondary | ICD-10-CM | POA: Diagnosis not present

## 2022-09-28 DIAGNOSIS — O09291 Supervision of pregnancy with other poor reproductive or obstetric history, first trimester: Secondary | ICD-10-CM

## 2022-09-28 LAB — CBC
HCT: 36.9 % (ref 36.0–46.0)
Hemoglobin: 12.2 g/dL (ref 12.0–15.0)
MCH: 27.9 pg (ref 26.0–34.0)
MCHC: 33.1 g/dL (ref 30.0–36.0)
MCV: 84.4 fL (ref 80.0–100.0)
Platelets: 311 10*3/uL (ref 150–400)
RBC: 4.37 MIL/uL (ref 3.87–5.11)
RDW: 14.6 % (ref 11.5–15.5)
WBC: 10.3 10*3/uL (ref 4.0–10.5)
nRBC: 0 % (ref 0.0–0.2)

## 2022-09-28 LAB — TYPE AND SCREEN
ABO/RH(D): B POS
Antibody Screen: NEGATIVE

## 2022-09-28 MED ORDER — LACTATED RINGERS IV SOLN: INTRAVENOUS | Status: DC

## 2022-09-28 MED ORDER — ACETAMINOPHEN 325 MG PO TABS: 650.0000 mg | ORAL_TABLET | ORAL | Status: DC | PRN

## 2022-09-28 MED ORDER — SOD CITRATE-CITRIC ACID 500-334 MG/5ML PO SOLN: 30.0000 mL | ORAL | Status: DC | PRN

## 2022-09-28 MED ORDER — TERBUTALINE SULFATE 1 MG/ML IJ SOLN: 0.2500 mg | Freq: Once | INTRAMUSCULAR | Status: DC | PRN

## 2022-09-28 MED ORDER — LACTATED RINGERS IV SOLN: 500.0000 mL | INTRAVENOUS | Status: DC | PRN

## 2022-09-28 MED ORDER — MISOPROSTOL 50MCG HALF TABLET
50.0000 ug | ORAL_TABLET | Freq: Once | ORAL | Status: AC
  Administered 2022-09-28: 50 ug via ORAL
  Filled 2022-09-28: qty 1

## 2022-09-28 MED ORDER — OXYTOCIN-SODIUM CHLORIDE 30-0.9 UT/500ML-% IV SOLN
2.5000 [IU]/h | INTRAVENOUS | Status: DC
  Administered 2022-09-29: 2.5 [IU]/h via INTRAVENOUS

## 2022-09-28 MED ORDER — OXYTOCIN 10 UNIT/ML IJ SOLN: 10.0000 [IU] | Freq: Once | INTRAMUSCULAR | Status: DC | PRN

## 2022-09-28 MED ORDER — FENTANYL CITRATE (PF) 100 MCG/2ML IJ SOLN
100.0000 ug | INTRAMUSCULAR | Status: DC | PRN
  Administered 2022-09-29: 100 ug via INTRAVENOUS
  Filled 2022-09-28: qty 2

## 2022-09-28 MED ORDER — OXYCODONE-ACETAMINOPHEN 5-325 MG PO TABS: 2.0000 | ORAL_TABLET | ORAL | Status: DC | PRN

## 2022-09-28 MED ORDER — ONDANSETRON HCL 4 MG/2ML IJ SOLN
4.0000 mg | Freq: Four times a day (QID) | INTRAMUSCULAR | Status: DC | PRN
  Administered 2022-09-29: 4 mg via INTRAVENOUS
  Filled 2022-09-28: qty 2

## 2022-09-28 MED ORDER — OXYCODONE-ACETAMINOPHEN 5-325 MG PO TABS: 1.0000 | ORAL_TABLET | ORAL | Status: DC | PRN

## 2022-09-28 MED ORDER — OXYTOCIN BOLUS FROM INFUSION
333.0000 mL | Freq: Once | INTRAVENOUS | Status: AC
  Administered 2022-09-29: 333 mL via INTRAVENOUS

## 2022-09-28 MED ORDER — LIDOCAINE HCL (PF) 1 % IJ SOLN: 30.0000 mL | INTRAMUSCULAR | Status: DC | PRN

## 2022-09-28 MED ORDER — OXYTOCIN-SODIUM CHLORIDE 30-0.9 UT/500ML-% IV SOLN
1.0000 m[IU]/min | INTRAVENOUS | Status: DC
  Administered 2022-09-28: 2 m[IU]/min via INTRAVENOUS
  Filled 2022-09-28: qty 500

## 2022-09-28 MED ORDER — MISOPROSTOL 25 MCG QUARTER TABLET
25.0000 ug | ORAL_TABLET | Freq: Once | ORAL | Status: AC
  Administered 2022-09-28: 25 ug via VAGINAL
  Filled 2022-09-28: qty 1

## 2022-09-28 NOTE — Progress Notes (Signed)
Labor Progress Note Valerie Clark is a 32 y.o. K4M0102 at [redacted]w[redacted]d presented for IOL due to history of IUFD. S: doing well, has had one dose of cytotec, feeling mild cramping. Pacing around her room to keep active.  O:  BP 131/75   Pulse 86   Temp 97.8 F (36.6 C) (Oral)   Resp 18   Ht  (1.626 m)   Wt 77.9 kg   LMP 12/25/2021 (Exact Date)   BMI 29.49 kg/m  EFM: 140bpm /moderate variability/+accels, no decels  Toco: Mild contractions every 3 mins. Uterine irritability  CVE: Dilation: 3 Effacement (%): 50 Cervical Position: Posterior Station: -3 Presentation: Vertex Exam by:: Dr. Lanae Crumbly   A&P: 32 y.o. [id number] [redacted]w[redacted]d IOL for history of IUFD #Labor: s/p 1 round of cytotec 50/25; plan to recheck in ~1hr, and consider starting pitocin +/- AROM #Pain: would like to avoid epidural, open to IV fentanyl for pain #FWB: Cat 1 #GBS negative   Valerie Wojdyla MD MPH OB Fellow, Faculty Practice [facility], Center for [facility] Healthcare 09/28/2022

## 2022-09-28 NOTE — Plan of Care (Signed)

## 2022-09-28 NOTE — Progress Notes (Signed)
Labor Progress Note Valerie Clark is a 32 y.o. [id number] at [redacted]w[redacted]d presented for IOL due to history of IUFD. S: feeling contractions, no new concerns  O:  BP 127/76   Pulse 80   Temp 98 F (36.7 C) (Oral)   Resp 18   Ht  (1.626 m)   Wt 77.9 kg   LMP 12/25/2021 (Exact Date)   BMI 29.49 kg/m  EFM: 140 bpm, moderate variability, + accelerations, no decelerations  Toco: Mild contractions every 3 mins. Uterine irritability  CVE: Dilation: 3.5 Effacement (%): 80 Cervical Position: Posterior Station: -3 Presentation: Vertex Exam by:: Sheralyn Pinegar,MD   A&P: 32 y.o. [id number] [redacted]w[redacted]d IOL for history of IUFD #Labor: s/p 1 round of cytotec 50/25; discussed possible AROM, will like to hold off. Start pitocin 2X2; recheck in ~3-4hrs and consider AROM then. #Pain: would like to avoid epidural, open to IV fentanyl for pain #FWB: Cat 1 #GBS negative   Valerie Pavlik MD MPH OB Fellow, Faculty Practice [facility], Center for [facility] Healthcare 09/28/2022

## 2022-09-28 NOTE — H&P (Addendum)
OBSTETRIC ADMISSION HISTORY AND PHYSICAL  Valerie Clark is a 32 y.o. female [phone] with IUP at [redacted]w[redacted]d by LMP c/w U/S presenting for IOL for hx of IUFD. She reports +FMs, No LOF, no VB, no blurry vision, headaches or peripheral edema, and RUQ pain.  She plans on breast and bottle feeding. She declined birth control. She received her prenatal care at  [facility]    Dating: By LMP --->  Estimated Date of Delivery: 10/01/22  Sono:    @[redacted]w[redacted]d , CWD, normal anatomy, cephalic presentation, posterior placental lie, 2758g, 32% EFW   Prenatal History/Complications: Prior IUFD (stillbirth, neg APS labs) @23weeks , GERD         Nursing Staff Provider  Office Location [facility] Dating  10/01/2022, by LMP c/w 9 week [place]  PNC Model Arly.Keller ] Traditional [ ]  Centering [ ]  Mom-Baby Dyad Anatomy US  Normal  Language  English      Flu Vaccine  Declined  Genetic/Carrier Screen  NIPS:   low risk AFP:   screen negative Horizon: Neg prior preg  TDaP Vaccine    07/12/22 Hgb A1C or  GTT Early 5.2 Third trimester normal  COVID Vaccine Vaccinated   LAB RESULTS   Rhogam  B/Positive/-- (11/15 1042)  Blood Type B/Positive/-- (11/15 1042)   Baby Feeding Plan Breast and Bottle Antibody Comment, See Final Results (11/15 1042)  Contraception Declined Rubella 13.30 (11/15 1042)  Circumcision Declined RPR Non Reactive (02/07 0803)   Pediatrician  [facility] HBsAg Negative (11/15 1042)   Support Person Husband HCVAb Non Reactive (11/15 1042)   Prenatal Classes NA HIV Non Reactive (02/07 0803)     BTL Consent NA GBS  Negative  VBAC Consent NA Pap       Diagnosis  Date Value Ref Range Status  04/19/2022     Final    - Negative for intraepithelial lesion or malignancy (NILM)             DME Rx Arly.Keller ] BP cuff Arly.Keller ] Weight Scale Waterbirth  [ ]  Class [ ]  Consent [ ]  CNM visit  PHQ9 & GAD7 [ X ] new OB [ x ] 28 weeks  [ x ] 36 weeks Induction  [ ]  Orders Entered [ ] Foley Y/N     Past Medical History: Past Medical  History:  Diagnosis Date   Diabetes mellitus without complication    Medical history non-contributory     Past Surgical History: Past Surgical History:  Procedure Laterality Date   NO PAST SURGERIES      Obstetrical History: OB History     Gravida  6   Para  3   Term  2   Preterm  1   AB  2   Living  2      SAB  2   IAB      Ectopic      Multiple  0   Live Births  2           Social History Social History   Socioeconomic History   Marital status: Married    Spouse name: Not on file   Number of children: Not on file   Years of education: Not on file   Highest education level: Not on file  Occupational History   Not on file  Tobacco Use   Smoking status: Never   Smokeless tobacco: Never  Vaping Use   Vaping Use: Never used  Substance and Sexual Activity   Alcohol use:  No   Drug use: No   Sexual activity: Yes    Partners: Male    Birth control/protection: None  Other Topics Concern   Not on file  Social History Narrative   Not on file   Social Determinants of Health   Financial Resource Strain: Low Risk  (08/26/2018)   Overall Financial Resource Strain (CARDIA)    Difficulty of Paying Living Expenses: Not hard at all  Food Insecurity: No Food Insecurity (08/26/2018)   Hunger Vital Sign    Worried About Running Out of Food in the Last Year: Never true    Ran Out of Food in the Last Year: Never true  Transportation Needs: No Transportation Needs (08/26/2018)   PRAPARE - Administrator, Civil Service (Medical): No    Lack of Transportation (Non-Medical): No  Physical Activity: Inactive (08/26/2018)   Exercise Vital Sign    Days of Exercise per Week: 0 days    Minutes of Exercise per Session: 0 min  Stress: No Stress Concern Present (08/26/2018)   [employer] of Occupational Health - Occupational Stress Questionnaire    Feeling of Stress : Only a little  Social Connections: Socially Integrated (08/26/2018)   Social  Connection and Isolation Panel [NHANES]    Frequency of Communication with Friends and Family: Twice a week    Frequency of Social Gatherings with Friends and Family: Twice a week    Attends Religious Services: More than 4 times per year    Active Member of [employer] or Organizations: Yes    Attends Banker Meetings: Never    Marital Status: Married    Family History: Family History  Problem Relation Age of Onset   Asthma Neg Hx    Cancer Neg Hx    Diabetes Neg Hx    Heart disease Neg Hx    Hypertension Neg Hx    Stroke Neg Hx     Allergies: No Known Allergies  Medications Prior to Admission  Medication Sig Dispense Refill Last Dose   aspirin EC 81 MG tablet Take 1 tablet (81 mg total) by mouth daily. Swallow whole. 30 tablet 5    DICLEGIS 10-10 MG TBEC Take 2 tablets by mouth at bedtime. If symptoms persist, add one tablet in the morning and one in the afternoon (Patient not taking: Reported on 07/12/2022) 100 tablet 5    famotidine (PEPCID) 20 MG tablet Take 20 mg by mouth 2 (two) times daily. (Patient not taking: Reported on 06/14/2022)      Prenatal-DSS-FeCb-FeGl-FA (CITRANATAL BLOOM) 90-1 MG TABS Take 1 tablet by mouth daily. 30 tablet 11      Review of Systems   All systems reviewed and negative except as stated in HPI  Last menstrual period 12/25/2021, unknown if currently breastfeeding. General appearance: alert, cooperative, and no distress Lungs: clear to auscultation bilaterally Heart: regular rate and rhythm Abdomen: soft, non-tender; bowel sounds normal Extremities: Homans sign is negative, no sign of DVT Presentation: cephalic Fetal monitoringBaseline: 140 bpm, Variability: Good {> 6 bpm), Accelerations: Reactive, and Decelerations: Absent Uterine activityFrequency: Every 2-8 minutes     Prenatal labs: ABO, Rh: B/Positive/-- (11/15 1042) Antibody: Comment, See Final Results (11/15 1042) Rubella: 13.30 (11/15 1042) RPR: Non Reactive (02/07 0803)   HBsAg: Negative (11/15 1042)  HIV: Non Reactive (02/07 0803)  GBS: Negative/-- (04/02 1104)  1 hr Glucola normal, 2 hour GGT WNL Genetic screening  NIPS LR female, Horizon neg Anatomy US normal   Prenatal Transfer Tool  Maternal Diabetes: No Genetic Screening: Normal Maternal Ultrasounds/Referrals: Normal Fetal Ultrasounds or other Referrals:  None Maternal Substance Abuse:  No Significant Maternal Medications:  None Significant Maternal Lab Results:  Group B Strep negative Number of Prenatal Visits:greater than 3 verified prenatal visits Other Comments:  None  No results found for this or any previous visit (from the past 24 hour(s)).  Patient Active Problem List   Diagnosis Date Noted   Post term pregnancy at [redacted] weeks gestation 09/28/2022   Supervision of high-risk pregnancy 02/27/2022   GERD (gastroesophageal reflux disease) 01/06/2020   History of gestational diabetes mellitus (GDM) in prior pregnancy, currently pregnant in first trimester 06/10/2018   Low serum vitamin D 05/12/2016   History of IUFD 05/08/2016    Assessment/Plan:  Deja Pisarski is a 32 y.o. Z6X0960 at [redacted]w[redacted]d here for IOL for hx of IUFD  Prenatal History/Complications: Prior IUFD (stillbirth, neg APS labs) , GERD  #Labor: Made great change from walking prior to presentation (3/50/-3). Cytotec 50/25 #Pain: No plans for epidural at this time #FWB: Cat 1 #ID: GBS neg #MOF: Breast and bottle  #MOC: Declined   Gilles Chiquito  09/28/2022, 5:03 PM   ___ GME ATTESTATION:  Evaluation and management procedures were performed by the [facility] Resident under my supervision. I was immediately available for direct supervision, assistance and direction throughout this encounter.  I also confirm that I have verified the information documented in the resident's note, and that I have also personally reperformed the pertinent components of the physical exam and all of the medical decision making  activities.  I have also made any necessary editorial changes.  Myrtie Hawk, DO OB Fellow, Faculty [facility], Center for [facility] Healthcare 09/28/2022 6:04 PM

## 2022-09-29 ENCOUNTER — Inpatient Hospital Stay (HOSPITAL_COMMUNITY): Payer: Medicaid Other | Admitting: Anesthesiology

## 2022-09-29 ENCOUNTER — Encounter (HOSPITAL_COMMUNITY): Payer: Self-pay | Admitting: Obstetrics and Gynecology

## 2022-09-29 DIAGNOSIS — O480 Post-term pregnancy: Secondary | ICD-10-CM

## 2022-09-29 DIAGNOSIS — Z3A40 40 weeks gestation of pregnancy: Secondary | ICD-10-CM

## 2022-09-29 DIAGNOSIS — O09292 Supervision of pregnancy with other poor reproductive or obstetric history, second trimester: Secondary | ICD-10-CM

## 2022-09-29 LAB — RPR: RPR Ser Ql: NONREACTIVE

## 2022-09-29 MED ORDER — DIBUCAINE (PERIANAL) 1 % EX OINT: 1.0000 | TOPICAL_OINTMENT | CUTANEOUS | Status: DC | PRN

## 2022-09-29 MED ORDER — LACTATED RINGERS IV SOLN
500.0000 mL | Freq: Once | INTRAVENOUS | Status: AC
  Administered 2022-09-29: 500 mL via INTRAVENOUS

## 2022-09-29 MED ORDER — LIDOCAINE HCL (PF) 1 % IJ SOLN
INTRAMUSCULAR | Status: DC | PRN
  Administered 2022-09-29: 5 mL via EPIDURAL

## 2022-09-29 MED ORDER — ONDANSETRON HCL 4 MG PO TABS: 4.0000 mg | ORAL_TABLET | ORAL | Status: DC | PRN

## 2022-09-29 MED ORDER — DIPHENHYDRAMINE HCL 25 MG PO CAPS: 25.0000 mg | ORAL_CAPSULE | Freq: Four times a day (QID) | ORAL | Status: DC | PRN

## 2022-09-29 MED ORDER — PHENYLEPHRINE 80 MCG/ML (10ML) SYRINGE FOR IV PUSH (FOR BLOOD PRESSURE SUPPORT): 80.0000 ug | PREFILLED_SYRINGE | INTRAVENOUS | Status: DC | PRN

## 2022-09-29 MED ORDER — PRENATAL MULTIVITAMIN CH
1.0000 | ORAL_TABLET | Freq: Every day | ORAL | Status: DC
  Administered 2022-09-29 – 2022-09-30 (×2): 1 via ORAL
  Filled 2022-09-29 (×2): qty 1

## 2022-09-29 MED ORDER — EPHEDRINE 5 MG/ML INJ
10.0000 mg | INTRAVENOUS | Status: DC | PRN
  Administered 2022-09-29: 10 mg via INTRAVENOUS

## 2022-09-29 MED ORDER — LIDOCAINE-EPINEPHRINE (PF) 1.5 %-1:200000 IJ SOLN
INTRAMUSCULAR | Status: DC | PRN
  Administered 2022-09-29: 5 mL via EPIDURAL

## 2022-09-29 MED ORDER — FENTANYL-BUPIVACAINE-NACL 0.5-0.125-0.9 MG/250ML-% EP SOLN
12.0000 mL/h | EPIDURAL | Status: DC | PRN
  Administered 2022-09-29: 12 mL/h via EPIDURAL
  Filled 2022-09-29: qty 250

## 2022-09-29 MED ORDER — EPHEDRINE 5 MG/ML INJ
10.0000 mg | INTRAVENOUS | Status: DC | PRN
  Filled 2022-09-29: qty 5

## 2022-09-29 MED ORDER — SENNOSIDES-DOCUSATE SODIUM 8.6-50 MG PO TABS
2.0000 | ORAL_TABLET | ORAL | Status: DC
  Administered 2022-09-30: 2 via ORAL
  Filled 2022-09-29: qty 2

## 2022-09-29 MED ORDER — SODIUM CHLORIDE 0.9% FLUSH
3.0000 mL | Freq: Two times a day (BID) | INTRAVENOUS | Status: DC
  Administered 2022-09-29: 3 mL via INTRAVENOUS

## 2022-09-29 MED ORDER — COCONUT OIL OIL: 1.0000 | TOPICAL_OIL | Status: DC | PRN

## 2022-09-29 MED ORDER — BENZOCAINE-MENTHOL 20-0.5 % EX AERO
1.0000 | INHALATION_SPRAY | CUTANEOUS | Status: DC | PRN
  Administered 2022-09-29: 1 via TOPICAL
  Filled 2022-09-29: qty 56

## 2022-09-29 MED ORDER — SIMETHICONE 80 MG PO CHEW: 80.0000 mg | CHEWABLE_TABLET | ORAL | Status: DC | PRN

## 2022-09-29 MED ORDER — SODIUM CHLORIDE 0.9 % IV SOLN: 250.0000 mL | INTRAVENOUS | Status: DC | PRN

## 2022-09-29 MED ORDER — SODIUM CHLORIDE 0.9% FLUSH: 3.0000 mL | INTRAVENOUS | Status: DC | PRN

## 2022-09-29 MED ORDER — ONDANSETRON HCL 4 MG/2ML IJ SOLN: 4.0000 mg | INTRAMUSCULAR | Status: DC | PRN

## 2022-09-29 MED ORDER — ZOLPIDEM TARTRATE 5 MG PO TABS: 5.0000 mg | ORAL_TABLET | Freq: Every evening | ORAL | Status: DC | PRN

## 2022-09-29 MED ORDER — IBUPROFEN 600 MG PO TABS
600.0000 mg | ORAL_TABLET | Freq: Four times a day (QID) | ORAL | Status: DC
  Administered 2022-09-29 – 2022-09-30 (×5): 600 mg via ORAL
  Filled 2022-09-29 (×4): qty 1

## 2022-09-29 MED ORDER — WITCH HAZEL-GLYCERIN EX PADS: 1.0000 | MEDICATED_PAD | CUTANEOUS | Status: DC | PRN

## 2022-09-29 MED ORDER — ACETAMINOPHEN 325 MG PO TABS: 650.0000 mg | ORAL_TABLET | ORAL | Status: DC | PRN

## 2022-09-29 MED ORDER — DIPHENHYDRAMINE HCL 50 MG/ML IJ SOLN: 12.5000 mg | INTRAMUSCULAR | Status: DC | PRN

## 2022-09-29 NOTE — Discharge Summary (Signed)
Postpartum Discharge Summary  Date of Service updated***     Patient Name: Valerie Clark DOB: 06-29-1990 MRN: [id number]  Date of admission: 09/28/2022 Delivery date:09/29/2022  Delivering provider: Warden Fillers  Date of discharge: 09/29/2022  Admitting diagnosis: Post term pregnancy at [redacted] weeks gestation [O48.0, Z3A.41] Intrauterine pregnancy: [redacted]w[redacted]d     Secondary diagnosis:  Principal Problem:   Post term pregnancy at [redacted] weeks gestation Active Problems:   Vaginal delivery   History of gestational diabetes mellitus (GDM) in prior pregnancy, currently pregnant in first trimester   Supervision of high-risk pregnancy  Additional problems: ***    Discharge diagnosis: Term Pregnancy Delivered                                              Post partum procedures:{Postpartum procedures:23558} Augmentation: AROM, Pitocin, and Cytotec Complications: None  Hospital course: Induction of Labor With Vaginal Delivery   32 y.o. yo [id number] at [redacted]w[redacted]d was admitted to the hospital 09/28/2022 for induction of labor.  Indication for induction:  h/o IUFD .  Patient had an labor course complicated by none. Membrane Rupture Time/Date: 6:10 AM ,09/29/2022   Delivery Method:Vaginal, Spontaneous  Episiotomy: None  Lacerations:  2nd degree  Details of delivery can be found in separate delivery note.  Patient had a postpartum course complicated by***. Patient is discharged home 09/29/22.  Newborn Data: Birth date:09/29/2022  Birth time:9:38 AM  Gender:Female  Living status:Living  Apgars:8 ,9  Weight:   Magnesium Sulfate received: {Mag received:30440022} BMZ received: No Rhophylac:N/A MMR:N/A T-DaP:Given prenatally Flu: No Transfusion:{Transfusion received:30440034}  Physical exam  Vitals:   09/29/22 0600 09/29/22 0630 09/29/22 0700 09/29/22 0730  BP: 132/80 (!) 105/52 131/81 130/81  Pulse: (!) 114 (!) 105 (!) 120 (!) 108  Resp:      Temp:  99.1 F (37.3 C)    TempSrc:  Oral    SpO2:       Weight:      Height:       General: {Exam; general:21111117} Lochia: {Desc; appropriate/inappropriate:30686::"appropriate"} Uterine Fundus: {Desc; firm/soft:30687} Incision: {Exam; incision:21111123} DVT Evaluation: {Exam; JYN:8295621} Labs: Lab Results  Component Value Date   WBC 10.3 09/28/2022   HGB 12.2 09/28/2022   HCT 36.9 09/28/2022   MCV 84.4 09/28/2022   PLT 311 09/28/2022      Latest Ref Rng & Units 01/06/2020    2:50 PM  CMP  Glucose 65 - 99 mg/dL 308   BUN 6 - 20 mg/dL 7   Creatinine 6.57 - 8.46 mg/dL 9.62   Sodium 952 - 841 mmol/L 134   Potassium 3.5 - 5.2 mmol/L 4.2   Chloride 96 - 106 mmol/L 100   CO2 20 - 29 mmol/L 22   Calcium 8.7 - 10.2 mg/dL 9.7   Total Protein 6.0 - 8.5 g/dL 7.5   Total Bilirubin 0.0 - 1.2 mg/dL <3.2   Alkaline Phos 48 - 121 IU/L 71   AST 0 - 40 IU/L 14   ALT 0 - 32 IU/L 15    Edinburgh Score:    09/23/2018    2:39 PM  Edinburgh Postnatal Depression Scale Screening Tool  I have been able to laugh and see the funny side of things. 0  I have looked forward with enjoyment to things. 0  I have blamed myself unnecessarily when things went wrong. 0  I have been anxious  or worried for no good reason. 0  I have felt scared or panicky for no good reason. 0  Things have been getting on top of me. 0  I have been so unhappy that I have had difficulty sleeping. 0  I have felt sad or miserable. 0  I have been so unhappy that I have been crying. 0  The thought of harming myself has occurred to me. 0  Edinburgh Postnatal Depression Scale Total 0     After visit meds:  Allergies as of 09/29/2022   No Known Allergies   Med Rec must be completed prior to using this [facility]***        Discharge home in stable condition Infant Feeding: {Baby feeding:23562} Infant Disposition:{CHL IP OB HOME WITH WJXBJY:78295} Discharge instruction: per After Visit Summary and Postpartum booklet. Activity: Advance as tolerated. Pelvic rest for 6  weeks.  Diet: {OB AOZH:08657846} Future Appointments: Future Appointments  Date Time Provider Department Center  10/03/2022  9:35 AM Carlynn Herald, CNM [facility] None   Follow up Visit: Message sent to [facility] 4/26  Please schedule this patient for a In person postpartum visit in 6 weeks with the following provider: Any provider. Additional Postpartum F/U: n/a   Low risk pregnancy complicated by:  n/a Delivery mode:  Vaginal, Spontaneous  Anticipated Birth Control:   declined   09/29/2022 Myrtie Hawk, DO

## 2022-09-29 NOTE — Lactation Note (Signed)
This note was copied from a baby's chart. Lactation Consultation Note  Patient Name: Valerie Clark Date: 09/29/2022 Age:32 hours  Reason for consult: Initial assessment;Term  P3, GA [redacted]w[redacted]d  Mother is resting in bed while infant is asleep in crib. Mother reports having successful breastfeeding experience with her other children when babies. She reports baby has latched well twice since delivery and feeding well.   Mother was given a manual breast pump for use after discharge as needed. Mom made aware of O/P services, breastfeeding support groups, and our phone # for post-discharge questions. Encouraged to request help from nursing staff/ LC when needed.   Maternal Data Has patient been taught Hand Expression?: No Does the patient have breastfeeding experience prior to this delivery?: Yes How long did the patient breastfeed?: >1 year  Feeding Mother's Current Feeding Choice: Breast Milk and Formula  LATCH Score   Not observed, infant sleeping    Lactation Tools Discussed/Used Tools: Pump Breast pump type: Manual Pump Education: Other (comment) (mother has used before, denies need for education) Reason for Pumping: for use after discharge as needed  Interventions Interventions: Education;LC Services brochure  Discharge Pump: Manual (given at mother's request)  Consult Status Consult Status: Follow-up Date: 09/30/22    Omar Person 09/29/2022, 5:54 PM

## 2022-09-29 NOTE — Anesthesia Preprocedure Evaluation (Signed)
Anesthesia Evaluation  Patient identified by MRN, date of birth, ID band Patient awake    Reviewed: Allergy & Precautions, H&P , NPO status , Patient's Chart, lab work & pertinent test results  History of Anesthesia Complications Negative for: history of anesthetic complications  Airway Mallampati: II       Dental no notable dental hx.    Pulmonary neg pulmonary ROS   Pulmonary exam normal        Cardiovascular negative cardio ROS Normal cardiovascular exam     Neuro/Psych negative neurological ROS  negative psych ROS   GI/Hepatic negative GI ROS, Neg liver ROS,,,  Endo/Other  diabetes    Renal/GU negative Renal ROS  negative genitourinary   Musculoskeletal   Abdominal   Peds  Hematology negative hematology ROS (+)   Anesthesia Other Findings   Reproductive/Obstetrics (+) Pregnancy                              Anesthesia Physical Anesthesia Plan  ASA: 2  Anesthesia Plan: Epidural   Post-op Pain Management:    Induction:   PONV Risk Score and Plan:   Airway Management Planned:   Additional Equipment:   Intra-op Plan:   Post-operative Plan:   Informed Consent: I have reviewed the patients History and Physical, chart, labs and discussed the procedure including the risks, benefits and alternatives for the proposed anesthesia with the patient or authorized representative who has indicated his/her understanding and acceptance.       Plan Discussed with:   Anesthesia Plan Comments:          Anesthesia Quick Evaluation

## 2022-09-29 NOTE — Progress Notes (Signed)
Labor Progress Note Valerie Clark is a 32 y.o. [id number] at [redacted]w[redacted]d presented for IOL Clark to history of IUFD. S: feeling intermittent pressure. Cervix fully dilated per RN check with bulging bag  O:  BP 132/80   Pulse (!) 114   Temp 97.8 F (36.6 C) (Oral)   Resp 18   Ht 5\' 4"  (1.626 m)   Wt 77.9 kg   LMP 12/25/2021 (Exact Date)   SpO2 98%   BMI 29.49 kg/m  EFM: 155bpm, moderate variability, +accels, no decels  Toco: continue every 2-3 mins  CVE: Dilation: 10 Dilation Complete Date: 09/29/22 Dilation Complete Time: 0458 Effacement (%): 100 Cervical Position: Posterior Station: 0 Presentation: Vertex Exam by:: Jo-Anne Kluth,MD   A&P: 32 y.o. [id number] [redacted]w[redacted]d IOL for history of IUFD #Labor: s/p 1 round of cytotec 50/25; AROM performed with clear fluid. On pitocin. Discussed pushing vs labor down. Will like to labor down for some time.  Plan to possibly start pushing in about 45 mins, sooner if feeling constant pressure. #Pain: epidural in place and comfortable #FWB: Cat 1 #GBS negative   Abdulraheem Pineo MD MPH OB Fellow, Faculty Practice [facility], Center for [facility] 09/29/2022

## 2022-09-29 NOTE — Anesthesia Procedure Notes (Signed)
Epidural Patient location during procedure: OB Start time: 09/29/2022 2:55 AM End time: 09/29/2022 3:05 AM  Staffing Anesthesiologist: Leonides Grills, MD Performed: anesthesiologist   Preanesthetic Checklist Completed: patient identified, IV checked, site marked, risks and benefits discussed, monitors and equipment checked, pre-op evaluation and timeout performed  Epidural Patient position: sitting Prep: DuraPrep Patient monitoring: heart rate, cardiac monitor, continuous pulse ox and blood pressure Approach: midline Location: L4-L5 Injection technique: LOR air  Needle:  Needle type: Tuohy  Needle gauge: 17 G Needle length: 9 cm Needle insertion depth: 5 cm Catheter type: closed end flexible Catheter size: 19 Gauge Catheter at skin depth: 10 cm Test dose: negative and 1.5% lidocaine with Epi 1:200 K  Assessment Events: blood not aspirated, no cerebrospinal fluid, injection not painful, no injection resistance and negative IV test  Additional Notes Informed consent obtained prior to proceeding including risk of failure, 1% risk of PDPH, risk of minor discomfort and bruising. Discussed alternatives to epidural analgesia and patient desires to proceed.  Timeout performed pre-procedure verifying patient name, procedure, and platelet count.  Patient tolerated procedure well. Reason for block:procedure for pain

## 2022-09-29 NOTE — Anesthesia Postprocedure Evaluation (Signed)
Anesthesia Post Note  Patient: Valerie Clark  Procedure(s) Performed: AN AD HOC LABOR EPIDURAL     Patient location during evaluation: Mother Baby Anesthesia Type: Epidural Level of consciousness: awake and alert Pain management: pain level controlled Vital Signs Assessment: post-procedure vital signs reviewed and stable Respiratory status: spontaneous breathing, nonlabored ventilation and respiratory function stable Cardiovascular status: stable Postop Assessment: no headache, no backache and epidural receding Anesthetic complications: no   No notable events documented.  Last Vitals:  Vitals:   09/29/22 1230 09/29/22 1710  BP: 123/71 114/65  Pulse: 81 88  Resp: 18 16  Temp: 36.6 C 36.8 C  SpO2: 99% 98%    Last Pain:  Vitals:   09/29/22 1710  TempSrc: Oral  PainSc: 0-No pain   Pain Goal:                   Salome Arnt

## 2022-09-30 DIAGNOSIS — D62 Acute posthemorrhagic anemia: Secondary | ICD-10-CM | POA: Diagnosis present

## 2022-09-30 LAB — BIRTH TISSUE RECOVERY COLLECTION (PLACENTA DONATION)

## 2022-09-30 LAB — CBC
HCT: 30 % — ABNORMAL LOW (ref 36.0–46.0)
Hemoglobin: 10.4 g/dL — ABNORMAL LOW (ref 12.0–15.0)
MCH: 29 pg (ref 26.0–34.0)
MCHC: 34.7 g/dL (ref 30.0–36.0)
MCV: 83.6 fL (ref 80.0–100.0)
Platelets: 235 10*3/uL (ref 150–400)
RBC: 3.59 MIL/uL — ABNORMAL LOW (ref 3.87–5.11)
RDW: 15.3 % (ref 11.5–15.5)
WBC: 13.5 10*3/uL — ABNORMAL HIGH (ref 4.0–10.5)
nRBC: 0 % (ref 0.0–0.2)

## 2022-09-30 MED ORDER — FERROUS SULFATE 325 (65 FE) MG PO TABS: 325.0000 mg | ORAL_TABLET | ORAL | 3 refills | Status: AC

## 2022-09-30 MED ORDER — ACETAMINOPHEN 500 MG PO TABS: 1000.0000 mg | ORAL_TABLET | Freq: Four times a day (QID) | ORAL | 2 refills | Status: AC | PRN

## 2022-09-30 MED ORDER — SENNOSIDES-DOCUSATE SODIUM 8.6-50 MG PO TABS: 2.0000 | ORAL_TABLET | Freq: Two times a day (BID) | ORAL | 2 refills | Status: AC | PRN

## 2022-09-30 MED ORDER — FERROUS SULFATE 325 (65 FE) MG PO TABS
325.0000 mg | ORAL_TABLET | ORAL | Status: DC
  Administered 2022-09-30: 325 mg via ORAL
  Filled 2022-09-30: qty 1

## 2022-09-30 MED ORDER — IBUPROFEN 600 MG PO TABS: 600.0000 mg | ORAL_TABLET | Freq: Four times a day (QID) | ORAL | 2 refills | Status: AC | PRN

## 2022-09-30 NOTE — Progress Notes (Signed)
Post Partum Day 1 Subjective: No complaints, up ad lib, voiding, tolerating PO, and + flatus. Desires to go home if baby can be discharged. Mild lochia.   Objective: Blood pressure 109/74, pulse 87, temperature 97.9 F (36.6 C), temperature source Oral, resp. rate 16, height 5\' 4"  (1.626 m), weight 77.9 kg, last menstrual period 12/25/2021, SpO2 98 %, unknown if currently breastfeeding.  Physical Exam:  General: alert and no distress Lochia: appropriate Uterine Fundus: firm DVT Evaluation: No evidence of DVT seen on physical exam. Negative Homan's sign.  No cords or calf tenderness. No significant calf/ankle edema. Recent Labs    09/28/22 1727 09/30/22 0535  HGB 12.2 10.4*  HCT 36.9 30.0*   Assessment/Plan: Started on oral iron therapy for asymptomatic postpartum acute blood loss anemia. No other concerns.   Plan for discharge today or tomorrow, Breastfeeding, and Contraception none.   LOS: 2 days   Jaynie Collins, MD 09/30/2022, 11:49 AM

## 2022-09-30 NOTE — Lactation Note (Signed)
This note was copied from a baby's chart. Lactation Consultation Note  Patient Name: Valerie Clark [id number] Date: 09/30/2022 Age:32 hours Reason for consult: Follow-up assessment  P3, Mother is concerned she does not have enough breastmilk because it's appearance is white and thin. Provided education on transitional breastmilk.  Reassured mother.  Encouraged breastfeed on demand before giving formula.  Reviewed engorgement care and monitoring voids/stools.   Maternal Data Does the patient have breastfeeding experience prior to this delivery?: Yes  Feeding Mother's Current Feeding Choice: Breast Milk and Formula   Interventions Interventions: Education  Discharge Discharge Education: Engorgement and breast care;Warning signs for feeding baby  Consult Status Consult Status: Complete Date: 09/30/22    Dahlia Byes [facility] 09/30/2022, 1:14 PM

## 2022-10-03 ENCOUNTER — Encounter: Payer: Medicaid Other | Admitting: Certified Nurse Midwife

## 2022-10-04 DIAGNOSIS — Z419 Encounter for procedure for purposes other than remedying health state, unspecified: Secondary | ICD-10-CM | POA: Diagnosis not present

## 2022-10-08 ENCOUNTER — Inpatient Hospital Stay (HOSPITAL_COMMUNITY): Payer: Medicaid Other

## 2022-10-11 ENCOUNTER — Telehealth (HOSPITAL_COMMUNITY): Payer: Self-pay | Admitting: *Deleted

## 2022-10-11 NOTE — Telephone Encounter (Signed)
Mom reports feeling good. No concerns about herself at this time. EPDS not completed. Mom reports EPDS was completed at peds appt this week. Feeling good emotionally. [facility] score=0) Mom reports baby is doing well. Feeding, peeing, and pooping without difficulty. Safe sleep reviewed. Mom reports no concerns about baby at present.  Duffy Rhody, RN 10-11-2022 at 3:12pm

## 2022-10-16 ENCOUNTER — Other Ambulatory Visit: Payer: Self-pay | Admitting: *Deleted

## 2022-10-16 MED ORDER — BENZOCAINE-MENTHOL 20-0.5 % EX AERO
1.0000 | INHALATION_SPRAY | Freq: Four times a day (QID) | CUTANEOUS | 0 refills | Status: AC | PRN
Start: 2022-10-16 — End: ?

## 2022-10-16 NOTE — Telephone Encounter (Signed)
TC to front office staff. Pt reports perineal sutures not dissolved and causing discomfort. Advised dermoplast spray, ice, and sitz bath. [facility] message sent about sitz type bath in regular clean bathtub with just epsom salt per advise Lisa Leftwhich-Kirby.

## 2022-11-04 DIAGNOSIS — Z419 Encounter for procedure for purposes other than remedying health state, unspecified: Secondary | ICD-10-CM | POA: Diagnosis not present

## 2022-11-13 ENCOUNTER — Encounter: Payer: Self-pay | Admitting: Obstetrics

## 2022-11-13 ENCOUNTER — Ambulatory Visit (INDEPENDENT_AMBULATORY_CARE_PROVIDER_SITE_OTHER): Payer: Medicaid Other | Admitting: Obstetrics

## 2022-11-13 DIAGNOSIS — Z30011 Encounter for initial prescription of contraceptive pills: Secondary | ICD-10-CM

## 2022-11-13 DIAGNOSIS — Z3009 Encounter for other general counseling and advice on contraception: Secondary | ICD-10-CM | POA: Diagnosis not present

## 2022-11-13 MED ORDER — NORETHINDRONE 0.35 MG PO TABS: 1.0000 | ORAL_TABLET | Freq: Every day | ORAL | 11 refills | Status: AC

## 2022-11-13 NOTE — Progress Notes (Signed)
Post Partum Visit Note  Valerie Clark is a 32 y.o. [id number] female who presents for a postpartum visit. She is 6 weeks postpartum following a normal spontaneous vaginal delivery.  I have fully reviewed the prenatal and intrapartum course. The delivery was at 39.5 gestational weeks.  Anesthesia: epidural. Postpartum course has been unremarkable. Baby is doing well. Baby is feeding by breast. Bleeding no bleeding. Bowel function is normal. Bladder function is normal. Patient is not sexually active. Contraception method is abstinence. Postpartum depression screening: negative.   Upstream - 11/13/22 0920       Pregnancy Intention Screening   Does the patient want to become pregnant in the next year? No    Does the patient's partner want to become pregnant in the next year? No    Would the patient like to discuss contraceptive options today? Yes            The pregnancy intention screening data noted above was reviewed. Potential methods of contraception were discussed. The patient elected to proceed with No data recorded.   Edinburgh Postnatal Depression Scale - 11/13/22 0919       Edinburgh Postnatal Depression Scale:  In the Past 7 Days   I have been able to laugh and see the funny side of things. 0    I have looked forward with enjoyment to things. 0    I have blamed myself unnecessarily when things went wrong. 0    I have been anxious or worried for no good reason. 0    I have felt scared or panicky for no good reason. 0    Things have been getting on top of me. 0    I have been so unhappy that I have had difficulty sleeping. 0    I have felt sad or miserable. 0    I have been so unhappy that I have been crying. 0    The thought of harming myself has occurred to me. 0    Edinburgh Postnatal Depression Scale Total 0             Health Maintenance Due  Topic Date Due   COVID-19 Vaccine (1) Never done    The following portions of the patient's history were reviewed and  updated as appropriate: allergies, current medications, past family history, past medical history, past social history, past surgical history, and problem list.  Review of Systems A comprehensive review of systems was negative.  Objective:  LMP 12/25/2021 (Exact Date)    General:  alert and no distress   Breasts:  normal  Lungs: clear to auscultation bilaterally  Heart:  regular rate and rhythm, S1, S2 normal, no murmur, click, rub or gallop  Abdomen: soft, non-tender; bowel sounds normal; no masses,  no organomegaly   Wound none  GU exam:  normal       Assessment:   1. Postpartum care following vaginal delivery - doing well  2. Encounter for other general counseling and advice on contraception - options discussed.  Breast feeding - wants OCP's  3. Encounter for initial prescription of contraceptive pills Rx: - POCT urine pregnancy - norethindrone (MICRONOR) 0.35 MG tablet; Take 1 tablet (0.35 mg total) by mouth daily.  Dispense: 28 tablet; Refill: 11    Plan:   Essential components of care per ACOG recommendations:  1.  Mood and well being: Patient with negative depression screening today. Reviewed local resources for support.  - Patient tobacco use? No.   - hx  of drug use? No.    2. Infant care and feeding:  -Patient currently breastmilk feeding? Yes. Discussed returning to work and pumping. Reviewed importance of draining breast regularly to support lactation.  -Social determinants of health (SDOH) reviewed in EPIC. No concerns  3. Sexuality, contraception and birth spacing - Patient does not want a pregnancy in the next year.  Desired family size is 3 children.  - Reviewed reproductive life planning. Reviewed contraceptive methods based on pt preferences and effectiveness.  Patient desired Oral Contraceptive today.   - Discussed birth spacing of 18 months  4. Sleep and fatigue -Encouraged family/partner/community support of 4 hrs of uninterrupted sleep to help  with mood and fatigue  5. Physical Recovery  - Discussed patients delivery and complications. She describes her labor as good. - Patient had a C-section, no problems at delivery. Patient had   no  laceration.  - Patient has urinary incontinence? No. - Patient is safe to resume physical and sexual activity  6.  Health Maintenance - HM due items addressed Yes - Last pap smear  Diagnosis  Date Value Ref Range Status  04/19/2022   Final   - Negative for intraepithelial lesion or malignancy (NILM)   Pap smear not done at today's visit.  -Breast Cancer screening indicated? No.   7. Chronic Disease/Pregnancy Condition follow up: None  Jermond Burkemper A. Clearance Coots, MD Center for [facility], [facility], [facility] 11/13/22

## 2022-12-04 DIAGNOSIS — Z419 Encounter for procedure for purposes other than remedying health state, unspecified: Secondary | ICD-10-CM | POA: Diagnosis not present

## 2023-01-04 DIAGNOSIS — Z419 Encounter for procedure for purposes other than remedying health state, unspecified: Secondary | ICD-10-CM | POA: Diagnosis not present

## 2023-02-04 DIAGNOSIS — Z419 Encounter for procedure for purposes other than remedying health state, unspecified: Secondary | ICD-10-CM | POA: Diagnosis not present

## 2023-03-06 DIAGNOSIS — Z419 Encounter for procedure for purposes other than remedying health state, unspecified: Secondary | ICD-10-CM | POA: Diagnosis not present

## 2023-04-02 DIAGNOSIS — R82998 Other abnormal findings in urine: Secondary | ICD-10-CM | POA: Diagnosis not present

## 2023-04-02 DIAGNOSIS — N3081 Other cystitis with hematuria: Secondary | ICD-10-CM | POA: Diagnosis not present

## 2023-04-06 DIAGNOSIS — Z419 Encounter for procedure for purposes other than remedying health state, unspecified: Secondary | ICD-10-CM | POA: Diagnosis not present

## 2023-05-06 DIAGNOSIS — Z419 Encounter for procedure for purposes other than remedying health state, unspecified: Secondary | ICD-10-CM | POA: Diagnosis not present

## 2023-05-08 ENCOUNTER — Other Ambulatory Visit: Payer: Self-pay | Admitting: Obstetrics

## 2023-05-11 ENCOUNTER — Other Ambulatory Visit: Payer: Self-pay | Admitting: Obstetrics

## 2023-05-15 MED ORDER — PRENATAL 27-1 MG PO TABS: 1.0000 | ORAL_TABLET | Freq: Every day | ORAL | 11 refills | Status: AC

## 2023-05-23 DIAGNOSIS — H5213 Myopia, bilateral: Secondary | ICD-10-CM | POA: Diagnosis not present

## 2023-06-06 DIAGNOSIS — Z419 Encounter for procedure for purposes other than remedying health state, unspecified: Secondary | ICD-10-CM | POA: Diagnosis not present

## 2023-07-07 DIAGNOSIS — Z419 Encounter for procedure for purposes other than remedying health state, unspecified: Secondary | ICD-10-CM | POA: Diagnosis not present

## 2023-08-04 DIAGNOSIS — Z419 Encounter for procedure for purposes other than remedying health state, unspecified: Secondary | ICD-10-CM | POA: Diagnosis not present

## 2023-09-15 DIAGNOSIS — Z419 Encounter for procedure for purposes other than remedying health state, unspecified: Secondary | ICD-10-CM | POA: Diagnosis not present

## 2023-10-15 DIAGNOSIS — Z419 Encounter for procedure for purposes other than remedying health state, unspecified: Secondary | ICD-10-CM | POA: Diagnosis not present

## 2023-11-09 DIAGNOSIS — R300 Dysuria: Secondary | ICD-10-CM | POA: Diagnosis not present

## 2023-11-15 DIAGNOSIS — Z419 Encounter for procedure for purposes other than remedying health state, unspecified: Secondary | ICD-10-CM | POA: Diagnosis not present

## 2023-12-11 DIAGNOSIS — Z0000 Encounter for general adult medical examination without abnormal findings: Secondary | ICD-10-CM | POA: Diagnosis not present

## 2023-12-11 DIAGNOSIS — Z7689 Persons encountering health services in other specified circumstances: Secondary | ICD-10-CM | POA: Diagnosis not present

## 2023-12-11 DIAGNOSIS — Z131 Encounter for screening for diabetes mellitus: Secondary | ICD-10-CM | POA: Diagnosis not present

## 2023-12-11 DIAGNOSIS — N3941 Urge incontinence: Secondary | ICD-10-CM | POA: Diagnosis not present

## 2023-12-11 DIAGNOSIS — E559 Vitamin D deficiency, unspecified: Secondary | ICD-10-CM | POA: Diagnosis not present

## 2023-12-11 DIAGNOSIS — Z13220 Encounter for screening for lipoid disorders: Secondary | ICD-10-CM | POA: Diagnosis not present

## 2023-12-15 DIAGNOSIS — Z419 Encounter for procedure for purposes other than remedying health state, unspecified: Secondary | ICD-10-CM | POA: Diagnosis not present

## 2024-01-15 DIAGNOSIS — Z419 Encounter for procedure for purposes other than remedying health state, unspecified: Secondary | ICD-10-CM | POA: Diagnosis not present

## 2024-02-15 DIAGNOSIS — Z419 Encounter for procedure for purposes other than remedying health state, unspecified: Secondary | ICD-10-CM | POA: Diagnosis not present
